# Patient Record
Sex: Female | Born: 1985
Health system: Southern US, Community
[De-identification: ages and names within clinical notes are randomized; demographics above are authoritative.]

## PROBLEM LIST (undated history)

## (undated) DIAGNOSIS — I499 Cardiac arrhythmia, unspecified: Secondary | ICD-10-CM

## (undated) DIAGNOSIS — F32A Depression, unspecified: Secondary | ICD-10-CM

## (undated) DIAGNOSIS — F419 Anxiety disorder, unspecified: Secondary | ICD-10-CM

## (undated) DIAGNOSIS — N200 Calculus of kidney: Secondary | ICD-10-CM

## (undated) DIAGNOSIS — N390 Urinary tract infection, site not specified: Secondary | ICD-10-CM

## (undated) DIAGNOSIS — D219 Benign neoplasm of connective and other soft tissue, unspecified: Secondary | ICD-10-CM

## (undated) DIAGNOSIS — R000 Tachycardia, unspecified: Secondary | ICD-10-CM

## (undated) DIAGNOSIS — T7840XA Allergy, unspecified, initial encounter: Secondary | ICD-10-CM

## (undated) HISTORY — DX: Calculus of kidney: N20.0

## (undated) HISTORY — DX: Cardiac arrhythmia, unspecified: I49.9

## (undated) HISTORY — DX: Benign neoplasm of connective and other soft tissue, unspecified: D21.9

## (undated) HISTORY — DX: Allergy, unspecified, initial encounter: T78.40XA

## (undated) HISTORY — DX: Anxiety disorder, unspecified: F41.9

## (undated) HISTORY — PX: EYE SURGERY: SHX253

## (undated) HISTORY — DX: Urinary tract infection, site not specified: N39.0

## (undated) HISTORY — PX: TONSILLECTOMY: SUR1361

## (undated) HISTORY — DX: Depression, unspecified: F32.A

---
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
## 2012-12-26 ENCOUNTER — Ambulatory Visit: Payer: Self-pay

## 2012-12-26 LAB — COMPREHENSIVE METABOLIC PANEL
Albumin: 4.6 g/dL (ref 3.4–5.0)
Anion Gap: 13 (ref 7–16)
BUN: 11 mg/dL (ref 7–18)
Calcium, Total: 9.1 mg/dL (ref 8.5–10.1)
Chloride: 105 mmol/L (ref 98–107)
Co2: 25 mmol/L (ref 21–32)
Creatinine: 0.86 mg/dL (ref 0.60–1.30)
EGFR (Non-African Amer.): >60
Potassium: 3.8 mmol/L (ref 3.5–5.1)
SGOT(AST): 15 U/L (ref 15–37)
SGPT (ALT): 21 U/L (ref 12–78)
Sodium: 143 mmol/L (ref 136–145)

## 2012-12-26 LAB — CBC WITH DIFFERENTIAL/PLATELET
Basophil %: 0.4 %
Eosinophil #: 0.5 10*3/uL (ref 0.0–0.7)
Eosinophil %: 6.2 %
HCT: 43.1 % (ref 35.0–47.0)
HGB: 14.8 g/dL (ref 12.0–16.0)
Lymphocyte #: 2.2 10*3/uL (ref 1.0–3.6)
Lymphocyte %: 25.8 %
MCH: 29.7 pg (ref 26.0–34.0)
MCV: 87 fL (ref 80–100)
Neutrophil %: 59.4 %
Platelet: 250 10*3/uL (ref 150–440)
RBC: 4.97 10*6/uL (ref 3.80–5.20)
RDW: 13 % (ref 11.5–14.5)
WBC: 8.4 10*3/uL (ref 3.6–11.0)

## 2012-12-26 LAB — URINALYSIS, COMPLETE
Bilirubin,UR: NEGATIVE
Glucose,UR: NEGATIVE mg/dL (ref 0–75)
Nitrite: NEGATIVE
Ph: 7.5 (ref 4.5–8.0)
Protein: NEGATIVE
Specific Gravity: 1.01 (ref 1.003–1.030)

## 2012-12-26 LAB — PREGNANCY, URINE: Pregnancy Test, Urine: NEGATIVE m[IU]/mL

## 2012-12-26 LAB — AMYLASE: Amylase: 52 U/L (ref 25–115)

## 2012-12-27 ENCOUNTER — Ambulatory Visit: Payer: Self-pay

## 2012-12-28 LAB — URINE CULTURE

## 2015-08-16 DIAGNOSIS — Z01419 Encounter for gynecological examination (general) (routine) without abnormal findings: Secondary | ICD-10-CM | POA: Diagnosis not present

## 2015-09-06 DIAGNOSIS — E559 Vitamin D deficiency, unspecified: Secondary | ICD-10-CM | POA: Diagnosis not present

## 2015-11-23 DIAGNOSIS — H5213 Myopia, bilateral: Secondary | ICD-10-CM | POA: Diagnosis not present

## 2015-11-23 DIAGNOSIS — H52221 Regular astigmatism, right eye: Secondary | ICD-10-CM | POA: Diagnosis not present

## 2015-12-03 ENCOUNTER — Encounter (HOSPITAL_COMMUNITY): Payer: Self-pay | Admitting: Emergency Medicine

## 2015-12-03 ENCOUNTER — Emergency Department (HOSPITAL_COMMUNITY)
Admission: EM | Admit: 2015-12-03 | Discharge: 2015-12-03 | Disposition: A | Discharge status: Home / Self Care | Payer: 59 | Attending: Emergency Medicine | Admitting: Emergency Medicine

## 2015-12-03 DIAGNOSIS — Z79899 Other long term (current) drug therapy: Secondary | ICD-10-CM | POA: Insufficient documentation

## 2015-12-03 DIAGNOSIS — T700XXA Otitic barotrauma, initial encounter: Secondary | ICD-10-CM | POA: Diagnosis not present

## 2015-12-03 DIAGNOSIS — H9209 Otalgia, unspecified ear: Secondary | ICD-10-CM | POA: Diagnosis present

## 2015-12-03 MED ORDER — GUAIFENESIN 100 MG/5ML PO LIQD: 100.0000 mg | ORAL | Status: DC | PRN

## 2015-12-03 MED ORDER — OXYMETAZOLINE HCL 0.05 % NA SOLN
1.0000 | Freq: Two times a day (BID) | NASAL | Status: DC | PRN
  Administered 2015-12-03: 1 via NASAL
  Filled 2015-12-03: qty 15

## 2015-12-03 NOTE — ED Notes (Signed)
Pt. reports bilateral ear ache more at left side onset yesterday while in an airplane , denies injury or drainage . No loss of hearing .

## 2015-12-03 NOTE — ED Provider Notes (Signed)
CSN: [id number]     Arrival date & time 12/03/15  0402 History   First MD Initiated Contact with Patient 12/03/15 0501     Chief Complaint  Patient presents with  . Otalgia     (Consider location/radiation/quality/duration/timing/severity/associated sxs/prior Treatment) HPI  Patient to the ER for evaluation of bilateral ear pressure. She flew on an airplane yesterday and when she landed she felt pressure behind her ears and says it sounds like she is under water. She has tried to take Sudafed but it did not relief her pain.   Her pulse is between 100-120 in the ED, she says the doctors and especially hospitals make her very nervous and that this is a normal response for her. She also says that typically her BP really shots up.. She declines wanting to be evaluated for this.   History reviewed. No pertinent past medical history. Past Surgical History  Procedure Laterality Date  . Tonsillectomy     No family history on file. Social History  Substance Use Topics  . Smoking status: Never Smoker   . Smokeless tobacco: None  . Alcohol Use: Yes   OB History    No data available     Review of Systems  Review of Systems All other systems negative except as documented in the HPI. All pertinent positives and negatives as reviewed in the HPI.   Allergies  Cephalosporins; Erythromycin; Penicillins; Sulfa antibiotics; Ceclor; Septra; and Suprax  Home Medications   Prior to Admission medications   Medication Sig Start Date End Date Taking? Authorizing Provider  nitrofurantoin, macrocrystal-monohydrate, (MACROBID) 100 MG capsule Take 100 mg by mouth 2 (two) times daily as needed. 08/16/15  Yes Historical Provider, MD  guaiFENesin (ROBITUSSIN) 100 MG/5ML liquid Take 5-10 mLs (100-200 mg total) by mouth every 4 (four) hours as needed (ear pressure). 12/03/15   Tenise Stetler Carlota Raspberry, PA-C   BP 126/84 mmHg  Pulse 118  Temp(Src) 97.8 F (36.6 C) (Oral)  Resp 20  Ht 5\' 3"  (1.6 m)  Wt 58.514  kg  BMI 22.86 kg/m2  SpO2 100%  LMP 11/26/2015 (Approximate) Physical Exam  Constitutional: She appears well-developed and well-nourished. No distress.  HENT:  Head: Normocephalic and atraumatic.  Bilateral retracted TMs. No sign of infection or fluid collection  Eyes: Conjunctivae, EOM and lids are normal. Pupils are equal, round, and reactive to light.  Neck: Normal range of motion. Neck supple. No spinous process tenderness and no muscular tenderness present.  Cardiovascular: Normal rate and regular rhythm.   Pulmonary/Chest: Effort normal.  Abdominal: Soft.  Neurological: She is alert.  Skin: Skin is warm and dry.  Nursing note and vitals reviewed.   ED Course  Procedures (including critical care time) Labs Review Labs Reviewed - No data to display  Imaging Review No results found. I have personally reviewed and evaluated these images and lab results as part of my medical decision-making.   EKG Interpretation None      MDM   Final diagnoses:  Barotitis media, initial encounter   The patient does not have any sign of infection. She tried Sudafed but it did not help. Will Rx:  Guaifenesin and Afrin  Referral to ENT  Medications  oxymetazoline (AFRIN) 0.05 % nasal spray 1 spray (not administered)   I discussed results, diagnoses and plan with Dorothy Fernandez. They voice there understanding and questions were answered. We discussed follow-up recommendations and return precautions.   The patients pulse is in the 120's discussed this with Dr. Stark Jock  and he is comfortable with the patient going home with her explanation of having tachycardia because of anxiety and white coat syndrome.     Dorothy Haring, PA-C 12/03/15 [id number]  Veryl Speak, MD 12/03/15 [phone]

## 2015-12-03 NOTE — Discharge Instructions (Signed)

## 2017-02-28 DIAGNOSIS — H52221 Regular astigmatism, right eye: Secondary | ICD-10-CM | POA: Diagnosis not present

## 2017-02-28 DIAGNOSIS — H5213 Myopia, bilateral: Secondary | ICD-10-CM | POA: Diagnosis not present

## 2017-11-22 ENCOUNTER — Other Ambulatory Visit: Payer: Self-pay

## 2017-11-22 ENCOUNTER — Other Ambulatory Visit: Payer: Self-pay | Admitting: *Deleted

## 2017-11-22 ENCOUNTER — Ambulatory Visit: Payer: Managed Care, Other (non HMO) | Admitting: Physician Assistant

## 2017-11-22 ENCOUNTER — Encounter: Payer: Self-pay | Admitting: Physician Assistant

## 2017-11-22 VITALS — BP 112/82 | HR 139 | Temp 98.8°F | Resp 16 | Ht 63.5 in | Wt 111.4 lb

## 2017-11-22 DIAGNOSIS — Z0000 Encounter for general adult medical examination without abnormal findings: Secondary | ICD-10-CM | POA: Diagnosis not present

## 2017-11-22 DIAGNOSIS — Z1329 Encounter for screening for other suspected endocrine disorder: Secondary | ICD-10-CM

## 2017-11-22 DIAGNOSIS — R000 Tachycardia, unspecified: Secondary | ICD-10-CM | POA: Diagnosis not present

## 2017-11-22 DIAGNOSIS — Z13220 Encounter for screening for lipoid disorders: Secondary | ICD-10-CM

## 2017-11-22 DIAGNOSIS — G479 Sleep disorder, unspecified: Secondary | ICD-10-CM | POA: Diagnosis not present

## 2017-11-22 DIAGNOSIS — Z1389 Encounter for screening for other disorder: Secondary | ICD-10-CM | POA: Diagnosis not present

## 2017-11-22 DIAGNOSIS — Z13228 Encounter for screening for other metabolic disorders: Secondary | ICD-10-CM | POA: Diagnosis not present

## 2017-11-22 DIAGNOSIS — F411 Generalized anxiety disorder: Secondary | ICD-10-CM

## 2017-11-22 DIAGNOSIS — Z130 Encounter for screening for diseases of the blood and blood-forming organs and certain disorders involving the immune mechanism: Secondary | ICD-10-CM | POA: Diagnosis not present

## 2017-11-22 LAB — POCT URINALYSIS DIP (MANUAL ENTRY)
BILIRUBIN UA: NEGATIVE mg/dL
Bilirubin, UA: NEGATIVE
Blood, UA: NEGATIVE
GLUCOSE UA: NEGATIVE mg/dL
LEUKOCYTES UA: NEGATIVE
Nitrite, UA: NEGATIVE
PROTEIN UA: NEGATIVE mg/dL
Spec Grav, UA: <=1.005 — AB (ref 1.010–1.025)
Urobilinogen, UA: 0.2 E.U./dL
pH, UA: 7 (ref 5.0–8.0)

## 2017-11-22 LAB — POCT URINE PREGNANCY: PREG TEST UR: NEGATIVE

## 2017-11-22 MED ORDER — BUSPIRONE HCL 7.5 MG PO TABS: 7.5000 mg | ORAL_TABLET | Freq: Two times a day (BID) | ORAL | 0 refills | Status: DC

## 2017-11-22 MED FILL — busPIRone HCL 7.5 MG TABS: 7.5 | 45 days supply | Qty: 90 | Fill #0

## 2017-11-22 NOTE — Patient Instructions (Addendum)
Please start buspar for anxiety. Try one tablet x 4-5 days then increase to one tablet twice daily.   For tachycardia, I have placed referral to cardiology and they should contact you within 2 weeks. Try to decrease caffeine consumption in half and no caffeine after 12pm. Try to decrease benedryl by half as well.  Follow up in 2 weeks.  For therapy -- [facility] ([place], [place], [place], [place]) - [phone] [facility] [place]) - [facility] - [phone] [facility] [place] - [phone] [facility]  - [phone] (do not file insurance)   Buspirone tablets What is this medicine? BUSPIRONE (byoo SPYE rone) is used to treat anxiety disorders. This medicine may be used for other purposes; ask your health care provider or pharmacist if you have questions. COMMON BRAND NAME(S): BuSpar What should I tell my health care provider before I take this medicine? They need to know if you have any of these conditions: -kidney or liver disease -an unusual or allergic reaction to buspirone, other medicines, foods, dyes, or preservatives -pregnant or trying to get pregnant -breast-feeding How should I use this medicine? Take this medicine by mouth with a glass of water. Follow the directions on the prescription label. You may take this medicine with or without food. To ensure that this medicine always works the same way for you, you should take it either always with or always without food. Take your doses at regular intervals. Do not take your medicine more often than directed. Do not stop taking except on the advice of your doctor or health care professional. Talk to your pediatrician regarding the use of this medicine in children. Special care may be needed. Overdosage: If you think you have taken too much of this medicine contact a  poison control center or emergency room at once. NOTE: This medicine is only for you. Do not share this medicine with others. What if I miss a dose? If you miss a dose, take it as soon as you can. If it is almost time for your next dose, take only that dose. Do not take double or extra doses. What may interact with this medicine? Do not take this medicine with any of the following medications: -linezolid -MAOIs like Carbex, Eldepryl, Marplan, Nardil, and Parnate -methylene blue -procarbazine This medicine may also interact with the following medications: -diazepam -digoxin -diltiazem -erythromycin -grapefruit juice -haloperidol -medicines for mental depression or mood problems -medicines for seizures like carbamazepine, phenobarbital and phenytoin -nefazodone -other medications for anxiety -rifampin -ritonavir -some antifungal medicines like itraconazole, ketoconazole, and voriconazole -verapamil -warfarin This list may not describe all possible interactions. Give your health care provider a list of all the medicines, herbs, non-prescription drugs, or dietary supplements you use. Also tell them if you smoke, drink alcohol, or use illegal drugs. Some items may interact with your medicine. What should I watch for while using this medicine? Visit your doctor or health care professional for regular checks on your progress. It may take 1 to 2 weeks before your anxiety gets better. You may get drowsy or dizzy. Do not drive, use machinery, or do anything that needs mental alertness until you know how this drug affects you. Do not stand or sit up quickly, especially if you are an older patient. This reduces the risk of dizzy or fainting spells. Alcohol can make you more drowsy and dizzy. Avoid alcoholic drinks. What side effects  may I notice from receiving this medicine? Side effects that you should report to your doctor or health care professional as soon as possible: -blurred vision or  other vision changes -chest pain -confusion -difficulty breathing -feelings of hostility or anger -muscle aches and pains -numbness or tingling in hands or feet -ringing in the ears -skin rash and itching -vomiting -weakness Side effects that usually do not require medical attention (report to your doctor or health care professional if they continue or are bothersome): -disturbed dreams, nightmares -headache -nausea -restlessness or nervousness -sore throat and nasal congestion -stomach upset This list may not describe all possible side effects. Call your doctor for medical advice about side effects. You may report side effects to FDA at 1-800-FDA-1088. Where should I keep my medicine? Keep out of the reach of children. Store at room temperature below 30 degrees C (86 degrees F). Protect from light. Keep container tightly closed. Throw away any unused medicine after the expiration date. NOTE: This sheet is a summary. It may not cover all possible information. If you have questions about this medicine, talk to your doctor, pharmacist, or health care provider.  2018 Elsevier/Gold Standard (2010-01-13 18:06:11)    Health Maintenance, Female Adopting a healthy lifestyle and getting preventive care can go a long way to promote health and wellness. Talk with your health care provider about what schedule of regular examinations is right for you. This is a good chance for you to check in with your provider about disease prevention and staying healthy. In between checkups, there are plenty of things you can do on your own. Experts have done a lot of research about which lifestyle changes and preventive measures are most likely to keep you healthy. Ask your health care provider for more information. Weight and diet Eat a healthy diet  Be sure to include plenty of vegetables, fruits, low-fat dairy products, and lean protein.  Do not eat a lot of foods high in solid fats, added sugars, or  salt.  Get regular exercise. This is one of the most important things you can do for your health. ? Most adults should exercise for at least 150 minutes each week. The exercise should increase your heart rate and make you sweat (moderate-intensity exercise). ? Most adults should also do strengthening exercises at least twice a week. This is in addition to the moderate-intensity exercise.  Maintain a healthy weight  Body mass index (BMI) is a measurement that can be used to identify possible weight problems. It estimates body fat based on height and weight. Your health care provider can help determine your BMI and help you achieve or maintain a healthy weight.  For females 46 years of age and older: ? A BMI below 18.5 is considered underweight. ? A BMI of 18.5 to 24.9 is normal. ? A BMI of 25 to 29.9 is considered overweight. ? A BMI of 30 and above is considered obese.  Watch levels of cholesterol and blood lipids  You should start having your blood tested for lipids and cholesterol at 32 years of age, then have this test every 5 years.  You may need to have your cholesterol levels checked more often if: ? Your lipid or cholesterol levels are high. ? You are older than 32 years of age. ? You are at high risk for heart disease.  Cancer screening Lung Cancer  Lung cancer screening is recommended for adults 34-14 years old who are at high risk for lung cancer because of a  history of smoking.  A yearly low-dose CT scan of the lungs is recommended for people who: ? Currently smoke. ? Have quit within the past 15 years. ? Have at least a 30-pack-year history of smoking. A pack year is smoking an average of one pack of cigarettes a day for 1 year.  Yearly screening should continue until it has been 15 years since you quit.  Yearly screening should stop if you develop a health problem that would prevent you from having lung cancer treatment.  Breast Cancer  Practice breast  self-awareness. This means understanding how your breasts normally appear and feel.  It also means doing regular breast self-exams. Let your health care provider know about any changes, no matter how small.  If you are in your 20s or 30s, you should have a clinical breast exam (CBE) by a health care provider every 1-3 years as part of a regular health exam.  If you are 45 or older, have a CBE every year. Also consider having a breast X-ray (mammogram) every year.  If you have a family history of breast cancer, talk to your health care provider about genetic screening.  If you are at high risk for breast cancer, talk to your health care provider about having an MRI and a mammogram every year.  Breast cancer gene (BRCA) assessment is recommended for women who have family members with BRCA-related cancers. BRCA-related cancers include: ? Breast. ? Ovarian. ? Tubal. ? Peritoneal cancers.  Results of the assessment will determine the need for genetic counseling and BRCA1 and BRCA2 testing.  Cervical Cancer Your health care provider may recommend that you be screened regularly for cancer of the pelvic organs (ovaries, uterus, and vagina). This screening involves a pelvic examination, including checking for microscopic changes to the surface of your cervix (Pap test). You may be encouraged to have this screening done every 3 years, beginning at age 51.  For women ages 84-65, health care providers may recommend pelvic exams and Pap testing every 3 years, or they may recommend the Pap and pelvic exam, combined with testing for human papilloma virus (HPV), every 5 years. Some types of HPV increase your risk of cervical cancer. Testing for HPV may also be done on women of any age with unclear Pap test results.  Other health care providers may not recommend any screening for nonpregnant women who are considered low risk for pelvic cancer and who do not have symptoms. Ask your health care provider if a  screening pelvic exam is right for you.  If you have had past treatment for cervical cancer or a condition that could lead to cancer, you need Pap tests and screening for cancer for at least 20 years after your treatment. If Pap tests have been discontinued, your risk factors (such as having a new sexual partner) need to be reassessed to determine if screening should resume. Some women have medical problems that increase the chance of getting cervical cancer. In these cases, your health care provider may recommend more frequent screening and Pap tests.  Colorectal Cancer  This type of cancer can be detected and often prevented.  Routine colorectal cancer screening usually begins at 32 years of age and continues through 32 years of age.  Your health care provider may recommend screening at an earlier age if you have risk factors for colon cancer.  Your health care provider may also recommend using home test kits to check for hidden blood in the stool.  A small  camera at the end of a tube can be used to examine your colon directly (sigmoidoscopy or colonoscopy). This is done to check for the earliest forms of colorectal cancer.  Routine screening usually begins at age 55.  Direct examination of the colon should be repeated every 5-10 years through 32 years of age. However, you may need to be screened more often if early forms of precancerous polyps or small growths are found.  Skin Cancer  Check your skin from head to toe regularly.  Tell your health care provider about any new moles or changes in moles, especially if there is a change in a mole's shape or color.  Also tell your health care provider if you have a mole that is larger than the size of a pencil eraser.  Always use sunscreen. Apply sunscreen liberally and repeatedly throughout the day.  Protect yourself by wearing long sleeves, pants, a wide-brimmed hat, and sunglasses whenever you are outside.  Heart disease, diabetes, and  high blood pressure  High blood pressure causes heart disease and increases the risk of stroke. High blood pressure is more likely to develop in: ? People who have blood pressure in the high end of the normal range (130-139/85-89 mm Hg). ? People who are overweight or obese. ? People who are African American.  If you are 70-93 years of age, have your blood pressure checked every 3-5 years. If you are 58 years of age or older, have your blood pressure checked every year. You should have your blood pressure measured twice-once when you are at a hospital or clinic, and once when you are not at a hospital or clinic. Record the average of the two measurements. To check your blood pressure when you are not at a hospital or clinic, you can use: ? An automated blood pressure machine at a pharmacy. ? A home blood pressure monitor.  If you are between 42 years and 71 years old, ask your health care provider if you should take aspirin to prevent strokes.  Have regular diabetes screenings. This involves taking a blood sample to check your fasting blood sugar level. ? If you are at a normal weight and have a low risk for diabetes, have this test once every three years after 32 years of age. ? If you are overweight and have a high risk for diabetes, consider being tested at a younger age or more often. Preventing infection Hepatitis B  If you have a higher risk for hepatitis B, you should be screened for this virus. You are considered at high risk for hepatitis B if: ? You were born in a country where hepatitis B is common. Ask your health care provider which countries are considered high risk. ? Your parents were born in a high-risk country, and you have not been immunized against hepatitis B (hepatitis B vaccine). ? You have HIV or AIDS. ? You use needles to inject street drugs. ? You live with someone who has hepatitis B. ? You have had sex with someone who has hepatitis B. ? You get hemodialysis  treatment. ? You take certain medicines for conditions, including cancer, organ transplantation, and autoimmune conditions.  Hepatitis C  Blood testing is recommended for: ? Everyone born from [birth year] through 1965. ? Anyone with known risk factors for hepatitis C.  Sexually transmitted infections (STIs)  You should be screened for sexually transmitted infections (STIs) including gonorrhea and chlamydia if: ? You are sexually active and are younger than 32 years of  age. ? You are older than 32 years of age and your health care provider tells you that you are at risk for this type of infection. ? Your sexual activity has changed since you were last screened and you are at an increased risk for chlamydia or gonorrhea. Ask your health care provider if you are at risk.  If you do not have HIV, but are at risk, it may be recommended that you take a prescription medicine daily to prevent HIV infection. This is called pre-exposure prophylaxis (PrEP). You are considered at risk if: ? You are sexually active and do not regularly use condoms or know the HIV status of your partner(s). ? You take drugs by injection. ? You are sexually active with a partner who has HIV.  Talk with your health care provider about whether you are at high risk of being infected with HIV. If you choose to begin PrEP, you should first be tested for HIV. You should then be tested every 3 months for as long as you are taking PrEP. Pregnancy  If you are premenopausal and you may become pregnant, ask your health care provider about preconception counseling.  If you may become pregnant, take 400 to 800 micrograms (mcg) of folic acid every day.  If you want to prevent pregnancy, talk to your health care provider about birth control (contraception). Osteoporosis and menopause  Osteoporosis is a disease in which the bones lose minerals and strength with aging. This can result in serious bone fractures. Your risk for osteoporosis  can be identified using a bone density scan.  If you are 73 years of age or older, or if you are at risk for osteoporosis and fractures, ask your health care provider if you should be screened.  Ask your health care provider whether you should take a calcium or vitamin D supplement to lower your risk for osteoporosis.  Menopause may have certain physical symptoms and risks.  Hormone replacement therapy may reduce some of these symptoms and risks. Talk to your health care provider about whether hormone replacement therapy is right for you. Follow these instructions at home:  Schedule regular health, dental, and eye exams.  Stay current with your immunizations.  Do not use any tobacco products including cigarettes, chewing tobacco, or electronic cigarettes.  If you are pregnant, do not drink alcohol.  If you are breastfeeding, limit how much and how often you drink alcohol.  Limit alcohol intake to no more than 1 drink per day for nonpregnant women. One drink equals 12 ounces of beer, 5 ounces of wine, or 1 ounces of hard liquor.  Do not use street drugs.  Do not share needles.  Ask your health care provider for help if you need support or information about quitting drugs.  Tell your health care provider if you often feel depressed.  Tell your health care provider if you have ever been abused or do not feel safe at home. This information is not intended to replace advice given to you by your health care provider. Make sure you discuss any questions you have with your health care provider. Document Released: 12/19/2010 Document Revised: 11/11/2015 Document Reviewed: 03/09/2015 Elsevier Interactive Patient Education  2018 [employer].    IF you received an x-ray today, you will receive an invoice from [facility]. Please contact [facility] at [phone] with questions or concerns regarding your invoice.   IF you received labwork today, you will receive an  invoice from [facility]. Please contact [facility]  at [phone] with questions or concerns regarding your invoice.   Our billing staff will not be able to assist you with questions regarding bills from these companies.  You will be contacted with the lab results as soon as they are available. The fastest way to get your results is to activate your My Chart account. Instructions are located on the last page of this paperwork. If you have not heard from [place] regarding the results in 2 weeks, please contact this office.

## 2017-11-22 NOTE — Progress Notes (Signed)
Dorothy Fernandez  MRN: [id number] DOB: [birth year]  Subjective:  Pt is a 32 y.o. G0P0 female who presents for annual physical exam and to establish care. She is fasting today.   Exercise: Walks twice a week.   Diet: Protein shakes, salads, fruits, eggs, milk, cheese, yogurt. Drinks mostly coffee, water, and one energy drinks. Takes multivitamin occasionally.  Sleep: Insomnia for at least 6 months, gets about 4-5 hrs a night. Has a hard time falling asleep. Cuts caffeine off at 2 pm. Lies down at 9pm, usually falls asleep around 12am. Does not watch TV in bed. Stops looking at phone right before bed. Takes 75m of benadryl every night around 7-8pm x 6 months. Has tried melatonin   Menstural cycles: Regular, occur every 28 days.   Family planning: Patient is wanting to have children eventually.  She wants to get her anxiety under control before she starts to plan.  Does not want to take birth control at this time.  Plans to use fertility calendar, condoms, and pullout method as contraceptive.   Last dental exam: 2 years ago.  Last vision exam: 03/2017 Last pap smear: 3 years ago, wants to establish with gynecologist.  Vaccinations      Tetanus: 2016      HPV: Completed        Chronic issues: 1) Heart palpitations: Has had this issue for years but has never had it worked up. Did used to take propraolol 129mfor worsening sensation in school during presentations, it helped but did make her fatigued. Feels like her heart is beating fast most of the day. Sometimes has skipped beats. Denies chest pain, SOB, diaphoresis, lower leg swelling, nausea, and vomiting. Used to jog a lot and did not notice much difference in heart rate response. Does feel more stressed recently. Drinks 4-5 cups of coffee and occasional energy drink throughout the day. Drinks at least 64 oz of water a day. Denies smoking. Denies illicit drug use. Will drink alcohol occasionally. Takes benedryl 5021mightly for sleep. No PMH  of heart disease or thyroid disease. FH of tachycardia in mother, she takes verapamil.   2) Anxiety: Dealing with since entire life. Has exacerbated since she graduated in 2017.  Since starting her job she cannot control her worrying.  She worries about everything.  She has restlessness, difficulty concentrating, irritability, sleep disturbance.  Denies dysphoric mood, hallucinations, panic attacks, suicidal ideation, and homicidal ideation.  Has never seen counselor or tried medication for this. FH of chronic fatigue syndrome.   3) Recurrent UTIs: Takes macrobid 100m71mter sexual intercourse, Rx by gynecologist.   There are no active problems to display for this patient.   Current Outpatient Medications on File Prior to Visit  Medication Sig Dispense Refill  . nitrofurantoin, macrocrystal-monohydrate, (MACROBID) 100 MG capsule Take 100 mg by mouth 2 (two) times daily as needed.    . propranolol (INDERAL) 20 MG tablet Take by mouth.    . Multiple Vitamin (MULTIVITAMIN) capsule Take by mouth.    . triamcinolone cream (KENALOG) 0.1 % daily.     No current facility-administered medications on file prior to visit.     Allergies  Allergen Reactions  . Cephalosporins Rash  . Erythromycin Rash  . Penicillins Rash  . Sulfa Antibiotics Rash  . Ceclor [Cefaclor] Hives  . Septra [Sulfamethoxazole-Trimethoprim]   . Suprax [Cefixime] Hives    Social History   Socioeconomic History  . Marital status: Married    Spouse name: Not on  file  . Number of children: Not on file  . Years of education: Not on file  . Highest education level: Not on file  Occupational History  . Not on file  Social Needs  . Financial resource strain: Not on file  . Food insecurity:    Worry: Not on file    Inability: Not on file  . Transportation needs:    Medical: Not on file    Non-medical: Not on file  Tobacco Use  . Smoking status: Never Smoker  . Smokeless tobacco: Never Used  Substance and Sexual  Activity  . Alcohol use: Not Currently  . Drug use: No  . Sexual activity: Not on file  Lifestyle  . Physical activity:    Days per week: Not on file    Minutes per session: Not on file  . Stress: Not on file  Relationships  . Social connections:    Talks on phone: Not on file    Gets together: Not on file    Attends religious service: Not on file    Active member of club or organization: Not on file    Attends meetings of clubs or organizations: Not on file    Relationship status: Not on file  Other Topics Concern  . Not on file  Social History Narrative  . Not on file    Past Surgical History:  Procedure Laterality Date  . TONSILLECTOMY      Family History  Problem Relation Age of Onset  . Hyperlipidemia Mother   . Hyperlipidemia Father   . Cancer Maternal Grandmother        Cervical  . Diabetes Maternal Grandfather     Review of Systems  Constitutional: Negative for activity change, appetite change, chills, diaphoresis, fatigue, fever and unexpected weight change.  HENT: Negative for congestion, dental problem, drooling, ear discharge, ear pain, facial swelling, hearing loss, mouth sores, nosebleeds, postnasal drip, rhinorrhea, sinus pressure, sinus pain, sneezing, sore throat, tinnitus, trouble swallowing and voice change.   Eyes: Negative for photophobia, pain, discharge, redness, itching and visual disturbance.  Respiratory: Negative for apnea, cough, choking, chest tightness, shortness of breath, wheezing and stridor.   Cardiovascular: Positive for palpitations. Negative for chest pain and leg swelling.  Gastrointestinal: Negative for abdominal distention, abdominal pain, anal bleeding, blood in stool, constipation, diarrhea, nausea, rectal pain and vomiting.  Endocrine: Negative for cold intolerance, heat intolerance, polydipsia, polyphagia and polyuria.  Genitourinary: Negative for decreased urine volume, difficulty urinating, dyspareunia, dysuria, enuresis,  flank pain, frequency, genital sores, hematuria, menstrual problem, pelvic pain, urgency, vaginal bleeding, vaginal discharge and vaginal pain.  Musculoskeletal: Negative for arthralgias, back pain, gait problem, joint swelling, myalgias, neck pain and neck stiffness.  Skin: Negative for color change, pallor, rash and wound.  Allergic/Immunologic: Negative for environmental allergies, food allergies and immunocompromised state.  Neurological: Negative for dizziness, tremors, seizures, syncope, facial asymmetry, speech difficulty, weakness, light-headedness, numbness and headaches.  Hematological: Negative for adenopathy. Does not bruise/bleed easily.  Psychiatric/Behavioral: Positive for sleep disturbance. Negative for agitation, behavioral problems, confusion, decreased concentration, dysphoric mood, hallucinations, self-injury and suicidal ideas. The patient is nervous/anxious. The patient is not hyperactive.       Objective:  BP 112/82 (BP Location: Right Arm, Patient Position: Sitting, Cuff Size: Normal)   Pulse (!) 139   Temp 98.8 F (37.1 C) (Oral)   Resp 16   Ht 5' 3.5" (1.613 m)   Wt 111 lb 6.4 oz (50.5 kg)   LMP 10/28/2017  SpO2 100%   BMI 19.42 kg/m   Physical Exam  Constitutional: She is oriented to person, place, and time. She appears well-developed and well-nourished. No distress.  HENT:  Head: Normocephalic and atraumatic.  Right Ear: Hearing, tympanic membrane, external ear and ear canal normal.  Left Ear: Hearing, tympanic membrane, external ear and ear canal normal.  Nose: Nose normal.  Mouth/Throat: Uvula is midline, oropharynx is clear and moist and mucous membranes are normal. No oropharyngeal exudate.  Eyes: Pupils are equal, round, and reactive to light. Conjunctivae, EOM and lids are normal. No scleral icterus.  Neck: Trachea normal and normal range of motion. No thyroid mass and no thyromegaly present.  Cardiovascular: Regular rhythm, normal heart sounds and  intact distal pulses. Tachycardia present.  Pulmonary/Chest: Effort normal and breath sounds normal.  Abdominal: Soft. Normal appearance and bowel sounds are normal. There is no tenderness.  Lymphadenopathy:       Head (right side): No tonsillar, no preauricular, no posterior auricular and no occipital adenopathy present.       Head (left side): No tonsillar, no preauricular, no posterior auricular and no occipital adenopathy present.    She has no cervical adenopathy.       Right: No supraclavicular adenopathy present.       Left: No supraclavicular adenopathy present.  Neurological: She is alert and oriented to person, place, and time. She has normal strength and normal reflexes.  Skin: Skin is warm and dry.    Visual Acuity Screening   Right eye Left eye Both eyes  Without correction:     With correction: '20/15 20/15 20/15 '   Pulse Readings from Last 3 Encounters:  11/22/17 (!) 139  12/03/15 101    Results for orders placed or performed in visit on 11/22/17 (from the past 24 hour(s))  POCT urinalysis dipstick     Status: Abnormal   Collection Time: 11/22/17  9:14 AM  Result Value Ref Range   Color, UA yellow yellow   Clarity, UA clear clear   Glucose, UA negative negative mg/dL   Bilirubin, UA negative negative   Ketones, POC UA negative negative mg/dL   Spec Grav, UA <=1.005 (A) 1.010 - 1.025   Blood, UA negative negative   pH, UA 7.0 5.0 - 8.0   Protein Ur, POC negative negative mg/dL   Urobilinogen, UA 0.2 0.2 or 1.0 E.U./dL   Nitrite, UA Negative Negative   Leukocytes, UA Negative Negative    GAD 7 : Generalized Anxiety Score 11/22/2017  Nervous, Anxious, on Edge 3  Control/stop worrying 3  Worry too much - different things 3  Trouble relaxing 3  Restless 3  Easily annoyed or irritable 1  Afraid - awful might happen 1  Total GAD 7 Score 17     EKG shows sinus tachycardia with rate of 118bpm. PR and QRS intervals within normal limits. No prior EKG for  comparison.  Findings presented and discussed with Dr. Nolon Rod.    Assessment and Plan :  Discussed healthy lifestyle, diet, exercise, preventative care, vaccinations, and addressed patient's concerns. Plan for follow up in 2 weeks. Otherwise, plan for specific conditions below.  1. Annual physical exam Await lab results.   2. Screening, anemia, deficiency, iron - CBC with Differential/Platelet  3. Screening for metabolic disorder - VOH60+OVPC  4. Screening cholesterol level - Lipid panel  5. Screening for thyroid disorder - TSH  6. Screening for hematuria or proteinuria - POCT urinalysis dipstick  7. Tachycardia EKG with sinus  tachycardia. Suspect high amount of caffeine, OTC daily benedryl, and anxiety and playing roles in pt's tachycardia. Encouraged her to decrease caffeine and benedryl. Adding on buspar for anxiety. Will refer to cardiology for further evaluation and possible Holter Monitor as pt does report frequent "skipped beats" throughout the day.  - EKG 12-Lead - EKG 12-Lead - Ambulatory referral to Cardiology  8. Generalized anxiety disorder Pt meets DSM 5 criteria for GAD. Rec start medication and therapy. Will start with buspar at this time. Given contact info for local therapists. Plan to f/u in 2 weeks.  - POCT urine pregnancy - busPIRone (BUSPAR) 7.5 MG tablet; Take 1 tablet (7.5 mg total) by mouth 2 (two) times daily.  Dispense: 90 tablet; Refill: 0  9. Sleep disturbance Likely excess caffeine use and anxiety playing a huge role. Rec decreasing caffeine, no caffeine after 12pm, and treating anxiety. Will f/u in 2 weeks. Can consider trazodone if no improvement at that time.   Side effects, risks, benefits, and alternatives of the medications and treatment plan prescribed today were discussed, and patient expressed understanding of the instructions given. No barriers to understanding were identified. Red flags discussed in detail. Pt expressed understanding  regarding what to do in case of emergency/urgent symptoms.  Tenna Delaine, PA-C  [facility] 11/23/2017 4:13 PM

## 2017-11-23 ENCOUNTER — Encounter: Payer: Self-pay | Admitting: Physician Assistant

## 2017-11-23 LAB — LIPID PANEL
CHOLESTEROL TOTAL: 140 mg/dL (ref 100–199)
Chol/HDL Ratio: 2.2 ratio (ref 0.0–4.4)
HDL: 64 mg/dL (ref >39)
LDL Calculated: 68 mg/dL (ref 0–99)
TRIGLYCERIDES: 42 mg/dL (ref 0–149)
VLDL CHOLESTEROL CAL: 8 mg/dL (ref 5–40)

## 2017-11-23 LAB — CBC WITH DIFFERENTIAL/PLATELET
Basophils Absolute: 0.1 10*3/uL (ref 0.0–0.2)
Basos: 1 %
EOS (ABSOLUTE): 0.1 10*3/uL (ref 0.0–0.4)
Eos: 1 %
Hematocrit: 40.6 % (ref 34.0–46.6)
Hemoglobin: 14.2 g/dL (ref 11.1–15.9)
Immature Grans (Abs): 0 10*3/uL (ref 0.0–0.1)
Immature Granulocytes: 0 %
LYMPHS ABS: 2.2 10*3/uL (ref 0.7–3.1)
Lymphs: 35 %
MCH: 30.9 pg (ref 26.6–33.0)
MCHC: 35 g/dL (ref 31.5–35.7)
MCV: 89 fL (ref 79–97)
Monocytes Absolute: 0.6 10*3/uL (ref 0.1–0.9)
Monocytes: 10 %
NEUTROS ABS: 3.3 10*3/uL (ref 1.4–7.0)
Neutrophils: 53 %
PLATELETS: 279 10*3/uL (ref 150–450)
RBC: 4.59 x10E6/uL (ref 3.77–5.28)
RDW: 13.7 % (ref 12.3–15.4)
WBC: 6.2 10*3/uL (ref 3.4–10.8)

## 2017-11-23 LAB — CMP14+EGFR
ALK PHOS: 56 IU/L (ref 39–117)
ALT: 8 IU/L (ref 0–32)
AST: 19 IU/L (ref 0–40)
Albumin/Globulin Ratio: 2.1 (ref 1.2–2.2)
Albumin: 4.8 g/dL (ref 3.5–5.5)
BILIRUBIN TOTAL: 0.7 mg/dL (ref 0.0–1.2)
BUN / CREAT RATIO: 14 (ref 9–23)
BUN: 9 mg/dL (ref 6–20)
CHLORIDE: 108 mmol/L — AB (ref 96–106)
CO2: 19 mmol/L — AB (ref 20–29)
Calcium: 10 mg/dL (ref 8.7–10.2)
Creatinine, Ser: 0.66 mg/dL (ref 0.57–1.00)
GFR calc Af Amer: 135 mL/min/{1.73_m2} (ref >59)
GFR calc non Af Amer: 117 mL/min/{1.73_m2} (ref >59)
Globulin, Total: 2.3 g/dL (ref 1.5–4.5)
Glucose: 90 mg/dL (ref 65–99)
Potassium: 4.8 mmol/L (ref 3.5–5.2)
Sodium: 141 mmol/L (ref 134–144)
Total Protein: 7.1 g/dL (ref 6.0–8.5)

## 2017-11-23 LAB — TSH: TSH: 1.3 u[IU]/mL (ref 0.450–4.500)

## 2017-12-25 ENCOUNTER — Other Ambulatory Visit: Payer: Self-pay | Admitting: Internal Medicine

## 2017-12-25 ENCOUNTER — Other Ambulatory Visit: Payer: Self-pay | Admitting: Cardiology

## 2017-12-25 ENCOUNTER — Ambulatory Visit (INDEPENDENT_AMBULATORY_CARE_PROVIDER_SITE_OTHER): Payer: Managed Care, Other (non HMO)

## 2017-12-25 DIAGNOSIS — R000 Tachycardia, unspecified: Secondary | ICD-10-CM

## 2017-12-25 DIAGNOSIS — I499 Cardiac arrhythmia, unspecified: Secondary | ICD-10-CM

## 2017-12-28 ENCOUNTER — Encounter: Payer: Self-pay | Admitting: Physician Assistant

## 2017-12-28 ENCOUNTER — Other Ambulatory Visit: Payer: Self-pay

## 2017-12-28 ENCOUNTER — Ambulatory Visit: Payer: Managed Care, Other (non HMO) | Admitting: Physician Assistant

## 2017-12-28 VITALS — BP 124/78 | HR 103 | Temp 98.4°F | Resp 20 | Ht 63.9 in | Wt 112.0 lb

## 2017-12-28 DIAGNOSIS — F411 Generalized anxiety disorder: Secondary | ICD-10-CM

## 2017-12-28 DIAGNOSIS — G479 Sleep disorder, unspecified: Secondary | ICD-10-CM | POA: Diagnosis not present

## 2017-12-28 MED ORDER — TRAZODONE HCL 50 MG PO TABS: 25.0000 mg | ORAL_TABLET | Freq: Every evening | ORAL | 3 refills | Status: DC | PRN

## 2017-12-28 MED ORDER — BUSPIRONE HCL 10 MG PO TABS: 10.0000 mg | ORAL_TABLET | Freq: Two times a day (BID) | ORAL | 0 refills | Status: DC

## 2017-12-28 MED FILL — busPIRone HCL 10 MG TABS: 10 | 90 days supply | Qty: 180 | Fill #0

## 2017-12-28 MED FILL — traZODone HCL 50 MG TABS: 50 | 30 days supply | Qty: 30 | Fill #0

## 2017-12-28 NOTE — Progress Notes (Signed)
Dorothy Fernandez  MRN: [id number] DOB: 12/20/1985  Subjective:  Dorothy Fernandez is a 32 y.o. female seen in office today for a chief complaint of follow-up on anxiety and sleep issues.  She was last seen on 11/22/2017 for annual physical exam.  During this time she was diagnosed with generalized anxiety disorder.  Started on BuSpar twice daily.  Notes she is tolerating the medication well.  Denies any side effects from the medications.  Denies dizziness, insomnia, drowsiness, nausea, vomiting.  In terms of anxiety, notes it is working pretty well.  Her husband has noticed the biggest difference.  He feels like she is a completely different person in terms of anxiety and irritability.  She feels that she is worrying less and being able to rest a little better.  In terms of her sleep disturbance, she is sleeping better but still having some disturbance.  Has completely changed this to sleep hygiene.  She has eliminated all caffeine at this time.  No phone in the bed.  Still having difficulty staying asleep throughout the entire night.  Is interested in trying further medication for this.  Denies hallucinations, euphoria, suicidal ideation, and homicidal ideation.  Review of Systems Per HPI There are no active problems to display for this patient.   Current Outpatient Medications on File Prior to Visit  Medication Sig Dispense Refill  . busPIRone (BUSPAR) 7.5 MG tablet Take 1 tablet (7.5 mg total) by mouth 2 (two) times daily. 90 tablet 0  . Multiple Vitamin (MULTIVITAMIN) capsule Take by mouth.    . nitrofurantoin, macrocrystal-monohydrate, (MACROBID) 100 MG capsule Take 100 mg by mouth 2 (two) times daily as needed.    . propranolol (INDERAL) 20 MG tablet Take by mouth.    . triamcinolone cream (KENALOG) 0.1 % daily.     No current facility-administered medications on file prior to visit.     Allergies  Allergen Reactions  . Cephalosporins Rash  . Erythromycin Rash  . Penicillins Rash  . Sulfa  Antibiotics Rash  . Ceclor [Cefaclor] Hives  . Septra [Sulfamethoxazole-Trimethoprim]   . Suprax [Cefixime] Hives       Objective:  BP 124/78 (BP Location: Left Arm, Patient Position: Sitting, Cuff Size: Normal)   Pulse (!) 103   Temp 98.4 F (36.9 C) (Other (Comment))   Resp 20   Ht 5' 3.9" (1.623 m)   Wt 112 lb (50.8 kg)   LMP 12/26/2017 (Exact Date)   SpO2 100%   BMI 19.29 kg/m   Physical Exam  Constitutional: She is oriented to person, place, and time. She appears well-developed and well-nourished. No distress.  HENT:  Head: Normocephalic and atraumatic.  Eyes: Conjunctivae are normal.  Neck: Normal range of motion.  Cardiovascular: Regular rhythm and normal heart sounds. Tachycardia present.  Pulmonary/Chest: Effort normal.  Neurological: She is alert and oriented to person, place, and time.  Skin: Skin is warm and dry.  Psychiatric: She has a normal mood and affect.  Vitals reviewed.    GAD 7 : Generalized Anxiety Score 12/28/2017 11/22/2017  Nervous, Anxious, on Edge 3 3  Control/stop worrying 3 3  Worry too much - different things 3 3  Trouble relaxing 3 3  Restless 0 3  Easily annoyed or irritable 0 1  Afraid - awful might happen 1 1  Total GAD 7 Score 13 17  Anxiety Difficulty Not difficult at all -    Depression screen [facility] 2/9 12/28/2017 12/28/2017 11/22/2017 11/22/2017  Decreased Interest 0  0 0 0  Down, Depressed, Hopeless 0 0 0 0  PHQ - 2 Score 0 0 0 0  Altered sleeping 3 3 3  -  Tired, decreased energy 3 3 3  -  Change in appetite 0 0 0 -  Feeling bad or failure about yourself  0 0 1 -  Trouble concentrating 1 1 3  -  Moving slowly or fidgety/restless 0 0 0 -  Suicidal thoughts 0 0 0 -  PHQ-9 Score 7 7 10  -   Wt Readings from Last 3 Encounters:  12/28/17 112 lb (50.8 kg)  11/22/17 111 lb 6.4 oz (50.5 kg)  12/03/15 129 lb (58.5 kg)    Assessment and Plan :  1. Generalized anxiety disorder Improving but still think there is room for further  improvement. GAD score has decreased from 17 to 13.  Recommend increasing dose to BuSpar 10 mg twice daily.  Patient has not been able to reach out to a therapist yet but is planning to do so.  Strongly encouraged her to do this.  Follow-up in 3 months for reevaluation.   - busPIRone (BUSPAR) 10 MG tablet; Take 1 tablet (10 mg total) by mouth 2 (two) times daily.  Dispense: 180 tablet; Refill: 0  2. Sleep disturbance Encourage patient to continue healthy sleep hygiene techniques and limiting caffeine. Will attempt trial of trazodone at this time.  Follow-up in 3 months for reevaluation. - traZODone (DESYREL) 50 MG tablet; Take 0.5-1 tablets (25-50 mg total) by mouth at bedtime as needed for sleep.  Dispense: 30 tablet; Refill: 3  A total of 25 minutes was spent in the room with the patient, greater than 50% of which was in counseling/coordination of care regarding GAD and sleep disturbance.  Tenna Delaine PA-C  [facility] 12/28/2017 3:34 PM

## 2017-12-28 NOTE — Patient Instructions (Addendum)
Increase BuSpar dose to 10 mg twice daily.  For sleep, we will attempt trazodone.To start the Trazodone - start with 1/2 pill at night for the 1st 4 nights - increase the dose by 25mg  (1/2 pill) every 4-5 nights until either you sleep well, have side effects or reach a nightly dose of 150mg   Follow-up in 3 months for reevaluation.  Thank you for letting me participate in your health and well being.  Living With Anxiety After being diagnosed with an anxiety disorder, you may be relieved to know why you have felt or behaved a certain way. It is natural to also feel overwhelmed about the treatment ahead and what it will mean for your life. With care and support, you can manage this condition and recover from it. How to cope with anxiety Dealing with stress Stress is your body's reaction to life changes and events, both good and bad. Stress can last just a few hours or it can be ongoing. Stress can play a major role in anxiety, so it is important to learn both how to cope with stress and how to think about it differently. Talk with your health care provider or a counselor to learn more about stress reduction. He or she may suggest some stress reduction techniques, such as:  Music therapy. This can include creating or listening to music that you enjoy and that inspires you.  Mindfulness-based meditation. This involves being aware of your normal breaths, rather than trying to control your breathing. It can be done while sitting or walking.  Centering prayer. This is a kind of meditation that involves focusing on a word, phrase, or sacred image that is meaningful to you and that brings you peace.  Deep breathing. To do this, expand your stomach and inhale slowly through your nose. Hold your breath for 3-5 seconds. Then exhale slowly, allowing your stomach muscles to relax.  Self-talk. This is a skill where you identify thought patterns that lead to anxiety reactions and correct those  thoughts.  Muscle relaxation. This involves tensing muscles then relaxing them.  Choose a stress reduction technique that fits your lifestyle and personality. Stress reduction techniques take time and practice. Set aside 5-15 minutes a day to do them. Therapists can offer training in these techniques. The training may be covered by some insurance plans. Other things you can do to manage stress include:  Keeping a stress diary. This can help you learn what triggers your stress and ways to control your response.  Thinking about how you respond to certain situations. You may not be able to control everything, but you can control your reaction.  Making time for activities that help you relax, and not feeling guilty about spending your time in this way.  Therapy combined with coping and stress-reduction skills provides the best chance for successful treatment. Medicines Medicines can help ease symptoms. Medicines for anxiety include:  Anti-anxiety drugs.  Antidepressants.  Beta-blockers.  Medicines may be used as the main treatment for anxiety disorder, along with therapy, or if other treatments are not working. Medicines should be prescribed by a health care provider. Relationships Relationships can play a big part in helping you recover. Try to spend more time connecting with trusted friends and family members. Consider going to couples counseling, taking family education classes, or going to family therapy. Therapy can help you and others better understand the condition. How to recognize changes in your condition Everyone has a different response to treatment for anxiety. Recovery from anxiety  happens when symptoms decrease and stop interfering with your daily activities at home or work. This may mean that you will start to:  Have better concentration and focus.  Sleep better.  Be less irritable.  Have more energy.  Have improved memory.  It is important to recognize when your  condition is getting worse. Contact your health care provider if your symptoms interfere with home or work and you do not feel like your condition is improving. Where to find help and support: You can get help and support from these sources:  Self-help groups.  Online and [employer].  A trusted spiritual leader.  Couples counseling.  Family education classes.  Family therapy.  Follow these instructions at home:  Eat a healthy diet that includes plenty of vegetables, fruits, whole grains, low-fat dairy products, and lean protein. Do not eat a lot of foods that are high in solid fats, added sugars, or salt.  Exercise. Most adults should do the following: ? Exercise for at least 150 minutes each week. The exercise should increase your heart rate and make you sweat (moderate-intensity exercise). ? Strengthening exercises at least twice a week.  Cut down on caffeine, tobacco, alcohol, and other potentially harmful substances.  Get the right amount and quality of sleep. Most adults need 7-9 hours of sleep each night.  Make choices that simplify your life.  Take over-the-counter and prescription medicines only as told by your health care provider.  Avoid caffeine, alcohol, and certain over-the-counter cold medicines. These may make you feel worse. Ask your pharmacist which medicines to avoid.  Keep all follow-up visits as told by your health care provider. This is important. Questions to ask your health care provider  Would I benefit from therapy?  How often should I follow up with a health care provider?  How long do I need to take medicine?  Are there any long-term side effects of my medicine?  Are there any alternatives to taking medicine? Contact a health care provider if:  You have a hard time staying focused or finishing daily tasks.  You spend many hours a day feeling worried about everyday life.  You become exhausted by worry.  You start to have  headaches, feel tense, or have nausea.  You urinate more than normal.  You have diarrhea. Get help right away if:  You have a racing heart and shortness of breath.  You have thoughts of hurting yourself or others. If you ever feel like you may hurt yourself or others, or have thoughts about taking your own life, get help right away. You can go to your nearest emergency department or call:  Your local emergency services (911 in the [place]  A suicide crisis helpline, such as the [place] at 548-748-2888. This is open 24-hours a day.  Summary  Taking steps to deal with stress can help calm you.  Medicines cannot cure anxiety disorders, but they can help ease symptoms.  Family, friends, and partners can play a big part in helping you recover from an anxiety disorder. This information is not intended to replace advice given to you by your health care provider. Make sure you discuss any questions you have with your health care provider. Document Released: 05/30/2016 Document Revised: 05/30/2016 Document Reviewed: 05/30/2016 Elsevier Interactive Patient Education  2018 [employer].   IF you received an x-ray today, you will receive an invoice from [facility]. Please contact [facility] at [phone] with questions or concerns regarding your invoice.  IF you received labwork today, you will receive an invoice from [facility]. Please contact [facility] at [phone] with questions or concerns regarding your invoice.   Our billing staff will not be able to assist you with questions regarding bills from these companies.  You will be contacted with the lab results as soon as they are available. The fastest way to get your results is to activate your My Chart account. Instructions are located on the last page of this paperwork. If you have not heard from [place] regarding the results in 2 weeks, please contact this office.

## 2018-01-07 MED FILL — PROPRANOLOL ER 120 MG CAP: 120 | 30 days supply | Qty: 30 | Fill #0

## 2018-01-20 ENCOUNTER — Encounter: Payer: Self-pay | Admitting: Physician Assistant

## 2018-02-04 MED FILL — PROPRANOLOL ER 80 MG CAP: 80 | 30 days supply | Qty: 30 | Fill #0

## 2018-03-06 MED FILL — PROPRANOLOL ER 80 MG CAP: 80 | 30 days supply | Qty: 30 | Fill #1

## 2018-04-04 ENCOUNTER — Other Ambulatory Visit: Payer: Self-pay

## 2018-04-04 ENCOUNTER — Encounter: Payer: Self-pay | Admitting: Physician Assistant

## 2018-04-04 ENCOUNTER — Ambulatory Visit: Payer: Managed Care, Other (non HMO) | Admitting: Physician Assistant

## 2018-04-04 VITALS — BP 111/70 | HR 87 | Temp 98.1°F | Resp 18 | Ht 63.19 in | Wt 113.6 lb

## 2018-04-04 DIAGNOSIS — F411 Generalized anxiety disorder: Secondary | ICD-10-CM | POA: Diagnosis not present

## 2018-04-04 DIAGNOSIS — G479 Sleep disorder, unspecified: Secondary | ICD-10-CM | POA: Diagnosis not present

## 2018-04-04 MED ORDER — BUSPIRONE HCL 10 MG PO TABS: 10.0000 mg | ORAL_TABLET | Freq: Two times a day (BID) | ORAL | 0 refills | Status: DC

## 2018-04-04 MED ORDER — HYDROXYZINE HCL 25 MG PO TABS: 25.0000 mg | ORAL_TABLET | Freq: Every day | ORAL | 0 refills | Status: DC

## 2018-04-04 MED FILL — hydrOXYzine HCL 25 MG TABS: 25 | 90 days supply | Qty: 90 | Fill #0

## 2018-04-04 MED FILL — PROPRANOLOL ER 80 MG CAP: 80 | 30 days supply | Qty: 30 | Fill #2

## 2018-04-04 MED FILL — busPIRone HCL 10 MG TABS: 10 | 90 days supply | Qty: 180 | Fill #0

## 2018-04-04 NOTE — Progress Notes (Signed)
Dorothy Fernandez  MRN: [id number] DOB: 11-10-1985  Subjective:  Dorothy Fernandez is a 31 y.o. female seen in office today for a chief complaint of follow-up and anxiety and insomnia.  Was taking BuSpar 10 mg twice a day for anxiety.  Anxiety is improving but still having episodes of worrying.  Tolerating BuSpar well.  Has not seen counseling yet.  Insomnia still persists.  She did not tolerate trazodone.  It made her feel worse.  She does take Benadryl nightly.  This helps.  Would like to try other options.  She is also currently trying to get pregnant.  Has follow-up with gynecology in November.  No other questions or concerns today.  Review of Systems Per HPI There are no active problems to display for this patient.   Current Outpatient Medications on File Prior to Visit  Medication Sig Dispense Refill  . busPIRone (BUSPAR) 10 MG tablet Take 1 tablet (10 mg total) by mouth 2 (two) times daily. 180 tablet 0  . Multiple Vitamin (MULTIVITAMIN) capsule Take by mouth.    . nitrofurantoin, macrocrystal-monohydrate, (MACROBID) 100 MG capsule Take 100 mg by mouth 2 (two) times daily as needed.    . propranolol ER (INDERAL LA) 80 MG 24 hr capsule   10  . triamcinolone cream (KENALOG) 0.1 % daily.    . propranolol (INDERAL) 20 MG tablet Take by mouth.    . traZODone (DESYREL) 50 MG tablet Take 0.5-1 tablets (25-50 mg total) by mouth at bedtime as needed for sleep. (Patient not taking: Reported on 04/04/2018) 30 tablet 3   No current facility-administered medications on file prior to visit.     Allergies  Allergen Reactions  . Cephalosporins Rash  . Erythromycin Rash  . Penicillins Rash  . Sulfa Antibiotics Rash  . Ceclor [Cefaclor] Hives  . Septra [Sulfamethoxazole-Trimethoprim]   . Suprax [Cefixime] Hives     Objective:  BP 111/70   Pulse 87   Temp 98.1 F (36.7 C) (Oral)   Resp 18   Ht 5' 3.19" (1.605 m)   Wt 113 lb 9.6 oz (51.5 kg)   LMP 03/28/2018 (Exact Date)   SpO2 100%   BMI  20.00 kg/m   Physical Exam  Constitutional: She is oriented to person, place, and time. She appears well-developed and well-nourished.  HENT:  Head: Normocephalic and atraumatic.  Eyes: Conjunctivae are normal.  Neck: Normal range of motion.  Pulmonary/Chest: Effort normal.  Neurological: She is alert and oriented to person, place, and time.  Skin: Skin is warm and dry.  Psychiatric: She has a normal mood and affect.  Vitals reviewed.    GAD 7 : Generalized Anxiety Score 04/04/2018 12/28/2017 11/22/2017  Nervous, Anxious, on Edge 2 3 3   Control/stop worrying 2 3 3   Worry too much - different things 2 3 3   Trouble relaxing 2 3 3   Restless 0 0 3  Easily annoyed or irritable 0 0 1  Afraid - awful might happen 1 1 1   Total GAD 7 Score 9 13 17   Anxiety Difficulty Not difficult at all Not difficult at all -      Assessment and Plan :  1. Generalized anxiety disorder Continues to improve.  GAD score has decreased from 13 to 9.  May increase to BuSpar 3 times a day.  Would suggest doing 5 mg for the third dose of the day to see how she tolerates it.  If tolerating well, can increase to 10 mg 3 times a  day.  Follow-up in 3 to 6 months for reevaluation. - busPIRone (BUSPAR) 10 MG tablet; Take 1 tablet (10 mg total) by mouth 2 (two) times daily.  Dispense: 180 tablet; Refill: 0  2. Trouble in sleeping No improvement with trazodone.  Will attempt trial of hydroxyzine. - hydrOXYzine (ATARAX/VISTARIL) 25 MG tablet; Take 1 tablet (25 mg total) by mouth at bedtime.  Dispense: 90 tablet; Refill: 0  Tenna Delaine PA-C  [facility] at [facility] 04/04/2018 10:32 AM

## 2018-04-04 NOTE — Patient Instructions (Addendum)
Follow up with me in 3-6 months. Try hydroxyzine for sleep (start with half tablet, can increase every few nights up to a dose of 100mg ). Do not take with benadryl. Talk to gyn about SSRI and hydroxyzine.  Thank you for letting me participate in your health and well being.    If you have lab work done today you will be contacted with your lab results within the next 2 weeks.  If you have not heard from [place] then please contact us. The fastest way to get your results is to register for My Chart.   IF you received an x-ray today, you will receive an invoice from [facility]. Please contact [facility] at [phone] with questions or concerns regarding your invoice.   IF you received labwork today, you will receive an invoice from [facility]. Please contact [facility] at [phone] with questions or concerns regarding your invoice.   Our billing staff will not be able to assist you with questions regarding bills from these companies.  You will be contacted with the lab results as soon as they are available. The fastest way to get your results is to activate your My Chart account. Instructions are located on the last page of this paperwork. If you have not heard from [place] regarding the results in 2 weeks, please contact this office.

## 2018-04-05 ENCOUNTER — Encounter: Payer: Self-pay | Admitting: Physician Assistant

## 2018-05-01 ENCOUNTER — Inpatient Hospital Stay (HOSPITAL_COMMUNITY)
Admission: AD | Admit: 2018-05-01 | Discharge: 2018-05-01 | Disposition: A | Discharge status: Home / Self Care | Payer: Managed Care, Other (non HMO) | Source: Ambulatory Visit | Attending: Obstetrics and Gynecology | Admitting: Obstetrics and Gynecology

## 2018-05-01 ENCOUNTER — Other Ambulatory Visit: Payer: Self-pay

## 2018-05-01 ENCOUNTER — Encounter (HOSPITAL_COMMUNITY): Payer: Self-pay | Admitting: *Deleted

## 2018-05-01 DIAGNOSIS — Z880 Allergy status to penicillin: Secondary | ICD-10-CM | POA: Diagnosis not present

## 2018-05-01 DIAGNOSIS — O00102 Left tubal pregnancy without intrauterine pregnancy: Secondary | ICD-10-CM

## 2018-05-01 HISTORY — DX: Tachycardia, unspecified: R00.0

## 2018-05-01 LAB — CBC
HCT: 40.3 % (ref 36.0–46.0)
Hemoglobin: 13.6 g/dL (ref 12.0–15.0)
MCH: 30.8 pg (ref 26.0–34.0)
MCHC: 33.7 g/dL (ref 30.0–36.0)
MCV: 91.4 fL (ref 80.0–100.0)
Platelets: 272 K/uL (ref 150–400)
RBC: 4.41 MIL/uL (ref 3.87–5.11)
RDW: 13.3 % (ref 11.5–15.5)
WBC: 13.4 K/uL — ABNORMAL HIGH (ref 4.0–10.5)
nRBC: 0 % (ref 0.0–0.2)

## 2018-05-01 LAB — COMPREHENSIVE METABOLIC PANEL
ALBUMIN: 4.6 g/dL (ref 3.5–5.0)
ALT: 13 U/L (ref 0–44)
ANION GAP: 10 (ref 5–15)
AST: 15 U/L (ref 15–41)
Alkaline Phosphatase: 50 U/L (ref 38–126)
BUN: 13 mg/dL (ref 6–20)
CHLORIDE: 105 mmol/L (ref 98–111)
CO2: 23 mmol/L (ref 22–32)
Calcium: 9.6 mg/dL (ref 8.9–10.3)
Creatinine, Ser: 0.73 mg/dL (ref 0.44–1.00)
GFR calc Af Amer: >60 mL/min (ref >60)
GFR calc non Af Amer: >60 mL/min (ref >60)
GLUCOSE: 101 mg/dL — AB (ref 70–99)
Potassium: 4.1 mmol/L (ref 3.5–5.1)
SODIUM: 138 mmol/L (ref 135–145)
Total Bilirubin: 0.8 mg/dL (ref 0.3–1.2)
Total Protein: 7.7 g/dL (ref 6.5–8.1)

## 2018-05-01 LAB — HCG, QUANTITATIVE, PREGNANCY: HCG, BETA CHAIN, QUANT, S: 3189 m[IU]/mL — AB (ref <5)

## 2018-05-01 LAB — ABO/RH: ABO/RH(D): O POS

## 2018-05-01 MED ORDER — PROMETHAZINE HCL 25 MG PO TABS: 12.5000 mg | ORAL_TABLET | Freq: Four times a day (QID) | ORAL | 0 refills | Status: DC | PRN

## 2018-05-01 MED ORDER — METHOTREXATE INJECTION FOR WOMEN'S HOSPITAL
50.0000 mg/m2 | Freq: Once | INTRAMUSCULAR | Status: AC
  Administered 2018-05-01: 75 mg via INTRAMUSCULAR
  Filled 2018-05-01: qty 1.5

## 2018-05-01 MED FILL — PROMETHAZINE 25 MG TABLET: 25 | 7 days supply | Qty: 30 | Fill #0

## 2018-05-01 NOTE — MAU Provider Note (Signed)
Chief Complaint: Ectopic Pregnancy   None     SUBJECTIVE HPI: Dorothy Fernandez is a 32 y.o. G1P0 who presents to maternity admissions sent from the office for methotrexate for ectopic pregnancy.  Pt reports light spotting and no pain today.  She was followed in the office and had no IUP and US showed left adnexal mass, suspicious for ectopic today.  She has not tried any treatments. There are no other associated symptoms.     HPI  Past Medical History:  Diagnosis Date  . Anxiety   . Tachycardia    Past Surgical History:  Procedure Laterality Date  . TONSILLECTOMY     Social History   Socioeconomic History  . Marital status: Married    Spouse name: Not on file  . Number of children: 0  . Years of education: Not on file  . Highest education level: Doctorate  Occupational History  . Occupation: Software engineer  Social Needs  . Financial resource strain: Not hard at all  . Food insecurity:    Worry: Never true    Inability: Never true  . Transportation needs:    Medical: No    Non-medical: No  Tobacco Use  . Smoking status: Never Smoker  . Smokeless tobacco: Never Used  Substance and Sexual Activity  . Alcohol use: Not Currently    Comment: 1 X a year  . Drug use: No  . Sexual activity: Yes    Partners: Male    Birth control/protection: None  Lifestyle  . Physical activity:    Days per week: 2 days    Minutes per session: 30 min  . Stress: Very much  Relationships  . Social connections:    Talks on phone: Not on file    Gets together: Not on file    Attends religious service: Not on file    Active member of club or organization: Not on file    Attends meetings of clubs or organizations: Not on file    Relationship status: Not on file  . Intimate partner violence:    Fear of current or ex partner: Not on file    Emotionally abused: Not on file    Physically abused: Not on file    Forced sexual activity: Not on file  Other Topics Concern  . Not on file  Social  History Narrative  . Not on file   No current facility-administered medications on file prior to encounter.    Current Outpatient Medications on File Prior to Encounter  Medication Sig Dispense Refill  . busPIRone (BUSPAR) 10 MG tablet Take 1 tablet (10 mg total) by mouth 2 (two) times daily. 180 tablet 0  . hydrOXYzine (ATARAX/VISTARIL) 25 MG tablet Take 1 tablet (25 mg total) by mouth at bedtime. 90 tablet 0  . Multiple Vitamin (MULTIVITAMIN) capsule Take by mouth.    . nitrofurantoin, macrocrystal-monohydrate, (MACROBID) 100 MG capsule Take 100 mg by mouth 2 (two) times daily as needed.    . propranolol (INDERAL) 20 MG tablet Take by mouth.    . propranolol ER (INDERAL LA) 80 MG 24 hr capsule   10  . triamcinolone cream (KENALOG) 0.1 % daily.     Allergies  Allergen Reactions  . Cephalosporins Rash  . Erythromycin Rash  . Penicillins Rash  . Sulfa Antibiotics Rash  . Ceclor [Cefaclor] Hives  . Septra [Sulfamethoxazole-Trimethoprim]   . Suprax [Cefixime] Hives    ROS:  Review of Systems  Constitutional: Negative for chills, fatigue and fever.  Respiratory: Negative for shortness of breath.   Cardiovascular: Negative for chest pain.  Gastrointestinal: Negative for abdominal pain, nausea and vomiting.  Genitourinary: Positive for vaginal bleeding. Negative for difficulty urinating, dysuria, flank pain, pelvic pain, vaginal discharge and vaginal pain.  Musculoskeletal: Negative for back pain.  Neurological: Negative for dizziness and headaches.  Psychiatric/Behavioral: Negative.      I have reviewed patient's Past Medical Hx, Surgical Hx, Family Hx, Social Hx, medications and allergies.   Physical Exam   Patient Vitals for the past 24 hrs:  BP Temp Pulse Resp SpO2 Height Weight  05/01/18 1323 125/77 98.4 F (36.9 C) 83 16 100 % 5\' 3"  (1.6 m) 51.3 kg   Constitutional: Well-developed, well-nourished female in no acute distress.  HEART: normal rate, heart sounds, regular  rhythm RESP: normal effort, lung sounds clear and equal bilaterally GI: Abd soft, non-tender. Pos BS x 4 MS: Extremities nontender, no edema, normal ROM Neurologic: Alert and oriented x 4.  GU: Neg CVAT.  PELVIC EXAM: Deferrred    LAB RESULTS Results for orders placed or performed during the hospital encounter of 05/01/18 (from the past 24 hour(s))  CBC     Status: Abnormal   Collection Time: 05/01/18  1:35 PM  Result Value Ref Range   WBC 13.4 (H) 4.0 - 10.5 K/uL   RBC 4.41 3.87 - 5.11 MIL/uL   Hemoglobin 13.6 12.0 - 15.0 g/dL   HCT 40.3 36.0 - 46.0 %   MCV 91.4 80.0 - 100.0 fL   MCH 30.8 26.0 - 34.0 pg   MCHC 33.7 30.0 - 36.0 g/dL   RDW 13.3 11.5 - 15.5 %   Platelets 272 150 - 400 K/uL   nRBC 0.0 0.0 - 0.2 %  Comprehensive metabolic panel     Status: Abnormal   Collection Time: 05/01/18  1:35 PM  Result Value Ref Range   Sodium 138 135 - 145 mmol/L   Potassium 4.1 3.5 - 5.1 mmol/L   Chloride 105 98 - 111 mmol/L   CO2 23 22 - 32 mmol/L   Glucose, Bld 101 (H) 70 - 99 mg/dL   BUN 13 6 - 20 mg/dL   Creatinine, Ser 0.73 0.44 - 1.00 mg/dL   Calcium 9.6 8.9 - 10.3 mg/dL   Total Protein 7.7 6.5 - 8.1 g/dL   Albumin 4.6 3.5 - 5.0 g/dL   AST 15 15 - 41 U/L   ALT 13 0 - 44 U/L   Alkaline Phosphatase 50 38 - 126 U/L   Total Bilirubin 0.8 0.3 - 1.2 mg/dL   GFR calc non Af Amer >60 >60 mL/min   GFR calc Af Amer >60 >60 mL/min   Anion gap 10 5 - 15  ABO/Rh     Status: None (Preliminary result)   Collection Time: 05/01/18  1:35 PM  Result Value Ref Range   ABO/RH(D)      O POS Performed at [facility], [address] [place], [place] [address]     --/--/O POS Performed at [facility], [address] [place], [place] [address]  (11/13 1335)  IMAGING No results found.      MAU Management/MDM: Dr Sandford Craze called to report pt presentation including [place] results in office.  [place] report faxed to MAU for review.  Ordered labs and reviewed results.  Hcg  pending.  Kidney and liver labs normal so MTX dose ordered and given in MAU today.  Pt to f/u on Day 4, Saturday, in MAU for hcg  and on Day 7, Tuesday, in the office. Return to MAU sooner as needed for emergencies.   Pt discharged with strict ectopic precautions.  ASSESSMENT 1. Left tubal pregnancy without intrauterine pregnancy     PLAN Discharge home Allergies as of 05/01/2018      Reactions   Cephalosporins Rash   Erythromycin Rash   Penicillins Rash   Sulfa Antibiotics Rash   Ceclor [cefaclor] Hives   Septra [sulfamethoxazole-trimethoprim]    Suprax [cefixime] Hives      Medication List    TAKE these medications   busPIRone 10 MG tablet Commonly known as:  BUSPAR Take 1 tablet (10 mg total) by mouth 2 (two) times daily.   hydrOXYzine 25 MG tablet Commonly known as:  ATARAX/VISTARIL Take 1 tablet (25 mg total) by mouth at bedtime.   multivitamin capsule Take by mouth.   nitrofurantoin (macrocrystal-monohydrate) 100 MG capsule Commonly known as:  MACROBID Take 100 mg by mouth 2 (two) times daily as needed.   promethazine 25 MG tablet Commonly known as:  PHENERGAN Take 0.5-1 tablets (12.5-25 mg total) by mouth every 6 (six) hours as needed for nausea.   propranolol 20 MG tablet Commonly known as:  INDERAL Take by mouth.   propranolol ER 80 MG 24 hr capsule Commonly known as:  INDERAL LA   triamcinolone cream 0.1 % Commonly known as:  KENALOG daily.      Follow-up Information    Bovard-Stuckert, Jody, MD Follow up.   Specialty:  Obstetrics and Gynecology Why:  On Saturday, 05/04/18 to MAU for labwork and on Tuesday, 05/07/18 to the office for labs. Return to MAU at any time with worsening pain or bleeding. Contact information: [place] [address] [place] [address] [phone]           Fatima Blank Certified Nurse-Midwife 05/01/2018  5:11 PM

## 2018-05-01 NOTE — MAU Note (Signed)
Pt sent from Dr Roe Rutherford office for methotrexate for ectopic pregnancy. Denies any pain

## 2018-05-04 ENCOUNTER — Inpatient Hospital Stay (HOSPITAL_COMMUNITY)
Admission: AD | Admit: 2018-05-04 | Discharge: 2018-05-04 | Disposition: A | Discharge status: Home / Self Care | Payer: Managed Care, Other (non HMO) | Source: Ambulatory Visit | Attending: Obstetrics and Gynecology | Admitting: Obstetrics and Gynecology

## 2018-05-04 DIAGNOSIS — O00102 Left tubal pregnancy without intrauterine pregnancy: Secondary | ICD-10-CM | POA: Insufficient documentation

## 2018-05-04 LAB — HCG, QUANTITATIVE, PREGNANCY: hCG, Beta Chain, Quant, S: 2890 m[IU]/mL — ABNORMAL HIGH (ref <5)

## 2018-05-04 NOTE — MAU Note (Addendum)
Dorothy Fernandez is a 32 y.o. here in MAU reporting: for follow up HCG, day 3 after methotrexate.  Pain score: denies Vaginal bleeding: denies Patient reports that she was unaware she needed to stay. Discussed with Sam CNM and best contact number obtained. [phone]. RN confirmed with patient next lab draw is on Tuesday. Patient verbalized understanding. Vitals:   05/04/18 1542  BP: 129/83  Pulse: 78  Resp: 17  Temp: 97.9 F (36.6 C)  SpO2: 100%     Lab orders placed from triage: HCG

## 2018-05-04 NOTE — MAU Provider Note (Signed)
History     CSN: [id number]  Arrival date and time: 05/04/18 1510   None     Chief Complaint  Patient presents with  . Follow-up   HPI  Dorothy Fernandez is a 32 y.o. G1P0 patient who presents to MAU for Day 3 labs after Methotrexate administration 05/01/18. Patient denies complaints or concerns at this time. Denies cramping or bleeding.  OB History    Gravida  1   Para  0   Term      Preterm      AB      Living  0     SAB      TAB      Ectopic      Multiple      Live Births              Past Medical History:  Diagnosis Date  . Anxiety   . Tachycardia     Past Surgical History:  Procedure Laterality Date  . TONSILLECTOMY      Family History  Problem Relation Age of Onset  . Hyperlipidemia Mother   . Cancer Maternal Grandmother        Cervical  . Diabetes Maternal Grandfather     Social History   Tobacco Use  . Smoking status: Never Smoker  . Smokeless tobacco: Never Used  Substance Use Topics  . Alcohol use: Not Currently    Comment: 1 X a year  . Drug use: No    Allergies:  Allergies  Allergen Reactions  . Cephalosporins Rash  . Erythromycin Rash  . Penicillins Rash  . Sulfa Antibiotics Rash  . Ceclor [Cefaclor] Hives  . Septra [Sulfamethoxazole-Trimethoprim]   . Suprax [Cefixime] Hives    No medications prior to admission.    Review of Systems  Gastrointestinal: Negative for abdominal pain, nausea and vomiting.  Genitourinary: Negative for vaginal bleeding, vaginal discharge and vaginal pain.  Neurological: Negative for dizziness.  All other systems reviewed and are negative.  Physical Exam   Blood pressure 129/83, pulse 78, temperature 97.9 F (36.6 C), temperature source Oral, resp. rate 17, SpO2 100 %.  Physical Exam  Nursing note and vitals reviewed. Constitutional: She is oriented to person, place, and time. She appears well-developed and well-nourished.  Cardiovascular: Normal rate.  Respiratory: Effort  normal.  GI: Soft. She exhibits no distension.  Neurological: She is alert and oriented to person, place, and time. She has normal reflexes.  Skin: Skin is warm and dry.  Psychiatric: She has a normal mood and affect. Her behavior is normal. Judgment and thought content normal.    MAU Course/MDM   Methotrexate Treatment Protocol for Ectopic Pregnancy   Treatment day  Single dose protocol   1  05/01/18 hCG.  Administer Methotrexate 50 mg/m2 body surface area IM  4  05/04/18 hCG  7  05/07/18 hCG  If <15 percent hCG decline from day 4 to 7, give additional dose of methotrexate 50 mg/m2 IM  If ?15 percent hCG decline from day 4 to 7, draw hCG weekly until undetectable   Patient Vitals for the past 24 hrs:  BP Temp Temp src Pulse Resp SpO2  05/04/18 1542 129/83 97.9 F (36.6 C) Oral 78 17 100 %     Assessment and Plan  --32 y.o. G1P0 patient s/p Methotrexate administration 11/13 --hCG pending. Pt did not realize she would be asked to wait for hCG results --Patient to present to clinic Tuesday 05/07/18 for Day 7 labs --  Discharge home in stable condition  Patient discharged prior to results of hCG. Called later the same day (11/16 at 1726) to discuss results and next steps. Patient understands plan to follow up in clinic Tuesday   [place], [place] 05/04/2018, 5:25 PM

## 2018-05-06 MED FILL — PROPRANOLOL ER 80 MG CAP: 80 | 30 days supply | Qty: 30 | Fill #3

## 2018-06-03 MED FILL — PROPRANOLOL ER 80 MG CAP: 80 | 30 days supply | Qty: 30 | Fill #4

## 2018-06-19 NOTE — L&D Delivery Note (Signed)
DELIVERY NOTE  Pt complete and at +2 station with urge to push. Epidural controlling pain. During pushing with good maternal effort, deep recurrent variable decelerations note with slower return to baseline. Decision made to expedite second stage of labor via operative vaginal delivery. NICU and OR staff notified, additional personal attended.   Risks and benefits discussed in detail.  Risks include, but are not limited to the risks of anesthesia, bleeding, infection, damage to maternal tissues, fetal cephalhematoma.  There is also the risk of inability to effect vaginal delivery of the head, or shoulder dystocia that cannot be resolved by established maneuvers, leading to the need for emergency cesarean section.   NICU team present at 1615. Position confirmed OA, low vacuum delivery performed Mity Vac w/ Bell head applied at 1615 3 pulls followed to bring fetal occiput to outlet position Popoff and reapplication x1 @ [id number]  Pt pushed and delivered a viable female infant in direct OA position. Anterior and posterior shoulders spontaneously delivered with next two pushes; body easily followed next. Loose nuchal x1, easily reduced. Infant placed on mothers abdomen and bulb suction of mouth and nose performed. Cord was then clamped and cut by FOB. Cord blood obtained, 3VC. Baby had a vigorous spontaneous cry noted. Placenta then delivered at 1622 intact. Fundal massage performed and pitocin per protocol. Fundus firm.  The following lacerations were noted:     Bilateral sulcal laceration - both repaired in 2 layers with running locked 3-0 vicryl     2nd degree perineal - repaired in routine fashion w/ 2-0 vicryl suture  Mother and baby stable. Counts correct   Infant time: 1618 Gender: female Placenta time: 1622 Apgars: 9/9 Weight: pending skin-to-skin  Excellent hemostasis noted at end of procedure. RVS examined via digital rectal exam after repair, and no suture nor collection was palpated.

## 2018-07-01 ENCOUNTER — Other Ambulatory Visit: Payer: Self-pay | Admitting: Physician Assistant

## 2018-07-01 DIAGNOSIS — F411 Generalized anxiety disorder: Secondary | ICD-10-CM

## 2018-07-02 ENCOUNTER — Encounter: Payer: Self-pay | Admitting: Physician Assistant

## 2018-07-04 ENCOUNTER — Other Ambulatory Visit: Payer: Self-pay | Admitting: *Deleted

## 2018-07-04 DIAGNOSIS — F411 Generalized anxiety disorder: Secondary | ICD-10-CM

## 2018-07-04 MED ORDER — BUSPIRONE HCL 10 MG PO TABS: 10.0000 mg | ORAL_TABLET | Freq: Two times a day (BID) | ORAL | 0 refills | Status: DC

## 2018-07-04 MED FILL — busPIRone HCL 10 MG TABS: 10 | 90 days supply | Qty: 180 | Fill #0

## 2018-07-04 MED FILL — PROPRANOLOL ER 80 MG CAP: 80 | 30 days supply | Qty: 30 | Fill #5

## 2018-07-11 LAB — HM PAP SMEAR: HPV, high-risk: NEGATIVE

## 2018-07-17 MED FILL — NITROFURANTOIN MCR 100 MG C: 100 | 30 days supply | Qty: 30 | Fill #0

## 2018-08-04 MED FILL — PROPRANOLOL ER 80 MG CAP: 80 | 30 days supply | Qty: 30 | Fill #6

## 2018-08-30 MED FILL — PROPRANOLOL ER 80 MG CAP: 80 | 30 days supply | Qty: 30 | Fill #7

## 2018-10-01 MED FILL — NITROFURANTOIN MCR 100 MG C: 100 | 30 days supply | Qty: 30 | Fill #1

## 2018-10-01 MED FILL — PROPRANOLOL HCL ER 80 MG CP: 80 | 30 days supply | Qty: 30 | Fill #8

## 2018-10-29 MED FILL — PROPRANOLOL HCL ER 80 MG CP: 80 | 30 days supply | Qty: 30 | Fill #9

## 2018-11-06 ENCOUNTER — Other Ambulatory Visit: Payer: Self-pay

## 2018-11-06 ENCOUNTER — Encounter: Payer: Self-pay | Admitting: Registered Nurse

## 2018-11-06 ENCOUNTER — Ambulatory Visit: Payer: Managed Care, Other (non HMO) | Admitting: Registered Nurse

## 2018-11-06 VITALS — BP 102/68 | HR 95 | Temp 98.0°F | Resp 16 | Ht 63.0 in | Wt 113.0 lb

## 2018-11-06 DIAGNOSIS — F411 Generalized anxiety disorder: Secondary | ICD-10-CM | POA: Diagnosis not present

## 2018-11-06 DIAGNOSIS — Z7689 Persons encountering health services in other specified circumstances: Secondary | ICD-10-CM

## 2018-11-06 DIAGNOSIS — Z3A01 Less than 8 weeks gestation of pregnancy: Secondary | ICD-10-CM

## 2018-11-06 DIAGNOSIS — Z3490 Encounter for supervision of normal pregnancy, unspecified, unspecified trimester: Secondary | ICD-10-CM | POA: Insufficient documentation

## 2018-11-06 MED ORDER — BUSPIRONE HCL 10 MG PO TABS: 10.0000 mg | ORAL_TABLET | Freq: Two times a day (BID) | ORAL | 0 refills | Status: DC

## 2018-11-06 MED FILL — busPIRone HCL 10 MG TABS: 10 | 90 days supply | Qty: 180 | Fill #0

## 2018-11-06 NOTE — Patient Instructions (Addendum)
If you have lab work done today you will be contacted with your lab results within the next 2 weeks.  If you have not heard from [place] then please contact us. The fastest way to get your results is to register for My Chart.   IF you received an x-ray today, you will receive an invoice from [facility]. Please contact [facility] at [phone] with questions or concerns regarding your invoice.   IF you received labwork today, you will receive an invoice from [facility]. Please contact [facility] at [phone] with questions or concerns regarding your invoice.   Our billing staff will not be able to assist you with questions regarding bills from these companies.  You will be contacted with the lab results as soon as they are available. The fastest way to get your results is to activate your My Chart account. Instructions are located on the last page of this paperwork. If you have not heard from [place] regarding the results in 2 weeks, please contact this office.      Abdominal Pain During Pregnancy  Abdominal pain is common during pregnancy, and has many possible causes. Some causes are more serious than others, and sometimes the cause is not known. Abdominal pain can be a sign that labor is starting. It can also be caused by normal growth and stretching of muscles and ligaments during pregnancy. Always tell your health care provider if you have any abdominal pain. Follow these instructions at home:  Do not have sex or put anything in your vagina until your pain goes away completely.  Get plenty of rest until your pain improves.  Drink enough fluid to keep your urine pale yellow.  Take over-the-counter and prescription medicines only as told by your health care provider.  Keep all follow-up visits as told by your health care provider. This is important. Contact a health care provider if:  Your pain continues or gets worse after resting.  You have lower abdominal pain  that: ? Comes and goes at regular intervals. ? Spreads to your back. ? Is similar to menstrual cramps.  You have pain or burning when you urinate. Get help right away if:  You have a fever or chills.  You have vaginal bleeding.  You are leaking fluid from your vagina.  You are passing tissue from your vagina.  You have vomiting or diarrhea that lasts for more than 24 hours.  Your baby is moving less than usual.  You feel very weak or faint.  You have shortness of breath.  You develop severe pain in your upper abdomen. Summary  Abdominal pain is common during pregnancy, and has many possible causes.  If you experience abdominal pain during pregnancy, tell your health care provider right away.  Follow your health care provider's home care instructions and keep all follow-up visits as directed. This information is not intended to replace advice given to you by your health care provider. Make sure you discuss any questions you have with your health care provider. Document Released: 06/05/2005 Document Revised: 09/07/2016 Document Reviewed: 09/07/2016 Elsevier Interactive Patient Education  2019 [place].  Activity Restriction During Pregnancy Your health care provider may recommend specific activity restrictions during pregnancy for a variety of reasons. Activity restriction may require that you limit activities that require great effort, such as exercise, lifting, or sex. The type of activity restriction will vary for each person, depending on your risk or the problems you are having. Activity restriction may be  recommended for a period of time until your baby is delivered. Why are activity restrictions recommended? Activity restriction may be recommended if:  Your placenta is partially or completely covering the opening of your cervix (placenta previa).  There is bleeding between the wall of the uterus and the amniotic sac in the first trimester of pregnancy  (subchorionic hemorrhage).  You went into labor too early (preterm labor).  You have a history of miscarriage.  You have a condition that causes high blood pressure during pregnancy (preeclampsia or eclampsia).  You are pregnant with more than one baby.  Your baby is not growing well. What are the risks? The risks depend on your specific restriction. Strict bed rest has the most physical and emotional risks and is no longer routinely recommended. Risks of strict bed rest include:  Loss of muscle conditioning from not moving.  Blood clots.  Social isolation.  Depression.  Loss of income. Talk with your health care team about activity restriction to decide if it is best for you and your baby. Even if you are having problems during your pregnancy, you may be able to continue with normal levels of activity with careful monitoring by your health care team. Follow these instructions at home: If needed, based on your overall health and the health of your baby, your health care provider will decide which type of activity restriction is right for you. Activity restrictions may include:  Not lifting anything heavier than 10 pounds (4.5 kg).  Avoiding activities that take a lot of physical effort.  No lifting or straining.  Resting in a sitting position or lying down for periods of time during the day. Pelvic rest may be recommended along with activity restrictions. If pelvic rest is recommended, then:  Do not have sex, an orgasm, or use sexual stimulation.  Do not use tampons. Do not douche. Do not put anything into your vagina.  Do not lift anything that is heavier than 10 lb (4.5 kg).  Avoid activities that require a lot of effort.  Avoid any activity in which your pelvic muscles could become strained, such as squatting. Questions to ask your health care provider  Why is my activity being limited?  How will activity restrictions affect my body?  Why is rest helpful for me  and my baby?  What activities can I do?  When can I return to normal activities? When should I seek immediate medical care? Seek immediate medical care if you have:  Vaginal bleeding.  Vaginal discharge.  Cramping pain in your lower abdomen.  Regular contractions.  A low, dull backache. Summary  Your health care provider may recommend specific activity restrictions during pregnancy for a variety of reasons.  Activity restriction may require that you limit activities such as exercise, lifting, sex, or any other activity that requires great effort.  Discuss the risks and benefits of activity restriction with your health care team to decide if it is best for you and your baby.  Contact your health care provider right away if you think you are having contractions, or if you notice vaginal bleeding, discharge, or cramping. This information is not intended to replace advice given to you by your health care provider. Make sure you discuss any questions you have with your health care provider. Document Released: 09/30/2010 Document Revised: 09/25/2017 Document Reviewed: 09/25/2017 Elsevier Interactive Patient Education  2019 [place].  Alcohol Use During Pregnancy  Drinking alcohol while you are pregnant can harm your unborn baby and cause  long-term problems for your baby after birth. When you drink alcohol, it passes through your placenta to your baby. A baby's liver cannot process alcohol like an adult's liver can, because it is not mature enough. How does this affect me? Drinking alcohol during pregnancy can increase your risk of:  Miscarriage.  Stillbirth.  Premature delivery.  Injury from falls.  Domestic violence.  Mood disorders, such as depression and postpartum depression. How does this affect my baby? Drinking alcohol during pregnancy can cause fetal alcohol spectrum disorder in your unborn baby (fetus). You are at the highest risk of having a baby affected by  alcohol if you have 7 or more drinks a week through most of your pregnancy. Fetal alcohol spectrum disorder (FASD) is a group of birth defects that can occur in a child whose mother drank excessive amounts of alcohol during pregnancy. Defects can include:  Behavior and attention problems.  Disorders of the brain and spinal cord (central nervous system disorders).  Problems with the heart, kidneys, and bones.  Decreased growth before and after birth.  Learning problems and intellectual disabilities.  Speech and language problems.  Vision and hearing problems.  Smaller head size than normal.  Sleep and sucking disorders.  Changes in the shape of the face. The only way to prevent the condition is to stop drinking alcohol the moment you realize that you are pregnant. Can I have any alcohol during my pregnancy? No amount of alcohol is safe during pregnancy. Because of this, it is best not to consume any alcohol at all. Do not drink alcohol if:  You plan to become pregnant.  You think you may be pregnant. If you are struggling not to drink, ask your health care provider for help. If you have a drinking problem, the sooner you get help to stop, the better your chance of having a healthy baby. Where to find more information  [employer] on Alcohol Abuse and Alcoholism: http://www.bradshaw.com/  Substance Abuse and Mental Health Services Administration: www.fascenter.SamedayNews.com.cy Summary  Drinking alcohol during your pregnancy can cause life-long harm to your developing baby.  No amount of alcohol is safe during pregnancy.  Stop drinking alcohol if you think you are pregnant or could become pregnant.  If you are pregnant and you are not able to stop drinking alcohol, talk with your health care provider right away. This information is not intended to replace advice given to you by your health care provider. Make sure you discuss any questions you have with your health care  provider. Document Released: 04/02/2007 Document Revised: 02/08/2017 Document Reviewed: 02/08/2017 Elsevier Interactive Patient Education  2019 [employer]  Back Pain in Pregnancy Back pain during pregnancy is common. Back pain may be caused by several factors that are related to changes during your pregnancy. Follow these instructions at home: Managing pain, stiffness, and swelling      If directed, for sudden (acute) back pain, put ice on the painful area. ? Put ice in a plastic bag. ? Place a towel between your skin and the bag. ? Leave the ice on for 20 minutes, 2-3 times per day.  If directed, apply heat to the affected area before you exercise. Use the heat source that your health care provider recommends, such as a moist heat pack or a heating pad. ? Place a towel between your skin and the heat source. ? Leave the heat on for 20-30 minutes. ? Remove the heat if your skin turns bright red. This is especially important  if you are unable to feel pain, heat, or cold. You may have a greater risk of getting burned.  If directed, massage the affected area. Activity  Exercise as told by your health care provider. Gentle exercise is the best way to prevent or manage back pain.  Listen to your body when lifting. If lifting hurts, ask for help or bend your knees. This uses your leg muscles instead of your back muscles.  Squat down when picking up something from the floor. Do not bend over.  Only use bed rest for short periods as told by your health care provider. Bed rest should only be used for the most severe episodes of back pain. Standing, sitting, and lying down  Do not stand in one place for long periods of time.  Use good posture when sitting. Make sure your head rests over your shoulders and is not hanging forward. Use a pillow on your lower back if necessary.  Try sleeping on your side, preferably the left side, with a pregnancy support pillow or 1-2 regular pillows  between your legs. ? If you have back pain after a night's rest, your bed may be too soft. ? A firm mattress may provide more support for your back during pregnancy. General instructions  Do not wear high heels.  Eat a healthy diet. Try to gain weight within your health care provider's recommendations.  Use a maternity girdle, elastic sling, or back brace as told by your health care provider.  Take over-the-counter and prescription medicines only as told by your health care provider.  Work with a physical therapist or massage therapist to find ways to manage back pain. Acupuncture or massage therapy may be helpful.  Keep all follow-up visits as told by your health care provider. This is important. Contact a health care provider if:  Your back pain interferes with your daily activities.  You have increasing pain in other parts of your body. Get help right away if:  You develop numbness, tingling, weakness, or problems with the use of your arms or legs.  You develop severe back pain that is not controlled with medicine.  You have a change in bowel or bladder control.  You develop shortness of breath, dizziness, or you faint.  You develop nausea, vomiting, or sweating.  You have back pain that is a rhythmic, cramping pain similar to labor pains. Labor pain is usually 1-2 minutes apart, lasts for about 1 minute, and involves a bearing down feeling or pressure in your pelvis.  You have back pain and your water breaks or you have vaginal bleeding.  You have back pain or numbness that travels down your leg.  Your back pain developed after you fell.  You develop pain on one side of your back.  You see blood in your urine.  You develop skin blisters in the area of your back pain. Summary  Back pain may be caused by several factors that are related to changes during your pregnancy.  Follow instructions as told by your health care provider for managing pain, stiffness, and  swelling.  Exercise as told by your health care provider. Gentle exercise is the best way to prevent or manage back pain.  Take over-the-counter and prescription medicines only as told by your health care provider.  Keep all follow-up visits as told by your health care provider. This is important. This information is not intended to replace advice given to you by your health care provider. Make sure you discuss any  questions you have with your health care provider. Document Released: 09/13/2005 Document Revised: 11/21/2017 Document Reviewed: 11/21/2017 Elsevier Interactive Patient Education  2019 [employer]. Exercise During Pregnancy For people of all ages, exercise is an important part of being healthy. Exercise improves heart and lung function and helps to maintain strength, flexibility, and a healthy body weight. Exercise also boosts energy levels and elevates mood. For most women, maintaining an exercise routine throughout pregnancy is recommended. It is only on rare occasions and with certain medical conditions or pregnancy complications that women may be asked to limit or avoid exercise during pregnancy. What are some other benefits to exercising during pregnancy? Along with maintaining strength and flexibility, exercising throughout pregnancy can help to:  Keep strength in muscles that are very important during labor and childbirth.  Decrease low back pain during pregnancy.  Decrease the risk of developing gestational diabetes mellitus (GDM).  Improve blood sugar (glucose) control for women who have GDM.  Decrease the risk of developing preeclampsia. This is a serious condition that causes high blood pressure along with other symptoms, such as swelling and headaches.  Decrease the risk of cesarean delivery.  Speed up the recovery after giving birth. How often should I exercise? Unless your health care provider gives you different instructions, you should try to exercise on  most days or all days of the week. In general, try to exercise with moderate intensity for about 150 minutes per week. This can be spread out across several days, such as exercising for 30 minutes per day on 5 days of each week. You can tell that you are exercising at a moderate intensity if you have a higher heart rate and faster breathing, but you are still able to hold a conversation. What types of moderate-intensity exercise are recommended during pregnancy? There are many types of exercise that are safe for you to do during pregnancy. Unless your health care provider gives you different instructions, do a variety of exercises that safely increase your heart and breathing (cardiopulmonary) rates and help you to build and maintain muscle strength (strength training). You should always be able to talk in full sentences while exercising during pregnancy. Some examples of exercising that is safe to do during pregnancy include:  Brisk walking or hiking.  Swimming.  Water aerobics.  Riding a stationary bike.  Strength training.  Modified yoga or Pilates. Tell your instructor that you are pregnant. Avoid overstretching and avoid lying on your back for long periods of time.  Running or jogging. Only choose this type of exercise if: ? You ran or jogged regularly before your pregnancy. ? You can run or jog and still talk in complete sentences. What types of exercise should I not do during pregnancy? Depending on your level of fitness and whether you exercised regularly before your pregnancy, you may be advised to limit vigorous-intensity exercise during your pregnancy. You can tell that you are exercising at a vigorous intensity if you are breathing much harder and faster and cannot hold a conversation while exercising. Some examples of exercising that you should avoid during pregnancy include:  Contact sports.  Activities that place you at risk for falling on or being hit in the belly, such as  downhill skiing, water skiing, surfing, rock climbing, cycling, gymnastics, and horseback riding.  Scuba diving.  Sky diving.  Yoga or Pilates in a room that is heated to extreme temperatures ("hot yoga" or "hot Pilates").  Jogging or running, unless you ran or jogged regularly  before your pregnancy. While jogging or running, you should always be able to talk in full sentences. Do not run or jog so vigorously that you are unable to have a conversation.  If you are not used to exercising at elevation (more than 6,000 feet above sea level), do not do so during your pregnancy. When should I avoid exercising during pregnancy? Certain medical conditions can make it unsafe to exercise during pregnancy, or they may increase your risk of miscarriage or early labor and birth. Some of these conditions include:  Some types of heart disease.  Some types of lung disease.  Placenta previa. This is when the placenta partially or completely covers the opening of the uterus (cervix).  Frequent bleeding from the vagina during your pregnancy.  Incompetent cervix. This is when your cervix does not remain as tightly closed during pregnancy as it should.  Premature labor.  Ruptured membranes. This is when the protective sac (amniotic sac) opens up and amniotic fluid leaks from your vagina.  Severely low blood count (anemia).  Preeclampsia or pregnancy-caused high blood pressure.  Carrying more than one baby (multiple gestation) and having an additional risk of early labor.  Poorly controlled diabetes.  Being severely underweight or severely overweight.  Intrauterine growth restriction. This is when your baby's growth and development during pregnancy are slower than expected.  Other medical conditions. Ask your health care provider if any apply to you. What else should I know about exercising during pregnancy? You should take these precautions while exercising during pregnancy:  Avoid  overheating. ? Wear loose-fitting, breathable clothes. ? Do not exercise in very high temperatures.  Avoid dehydration. Drink enough water before, during, and after exercise to keep your urine clear or pale yellow.  Avoid overstretching. Because of hormone changes during pregnancy, it is easy to overstretch muscles, tendons, and ligaments during pregnancy.  Start slowly and ask your health care provider to recommend types of exercise that are safe for you, if exercising regularly is new for you. Pregnancy is not a time for exercising to lose weight. When should I seek medical care? You should stop exercising and call your health care provider if you have any unusual symptoms, such as:  Mild uterine contractions or abdominal cramping.  Dizziness that does not improve with rest. When should I seek immediate medical care? You should stop exercising and call your local emergency services (911 in the [place] if you have any unusual symptoms, such as:  Sudden, severe pain in your low back or your belly.  Uterine contractions or abdominal cramping that do not improve with rest.  Chest pain.  Bleeding or fluid leaking from your vagina.  Shortness of breath. This information is not intended to replace advice given to you by your health care provider. Make sure you discuss any questions you have with your health care provider. Document Released: 06/05/2005 Document Revised: 11/03/2015 Document Reviewed: 08/13/2014 Elsevier Interactive Patient Education  2019 [place] of Pregnancy The first trimester of pregnancy is from week 1 until the end of week 13 (months 1 through 3). A week after a sperm fertilizes an egg, the egg will implant on the wall of the uterus. This embryo will begin to develop into a baby. Genes from you and your partner will form the baby. The female genes will determine whether the baby will be a boy or a girl. At 6-8 weeks, the eyes and face will be  formed, and the heartbeat can be seen  on ultrasound. At the end of 12 weeks, all the baby's organs will be formed. Now that you are pregnant, you will want to do everything you can to have a healthy baby. Two of the most important things are to get good prenatal care and to follow your health care provider's instructions. Prenatal care is all the medical care you receive before the baby's birth. This care will help prevent, find, and treat any problems during the pregnancy and childbirth. Body changes during your first trimester Your body goes through many changes during pregnancy. The changes vary from woman to woman.  You may gain or lose a couple of pounds at first.  You may feel sick to your stomach (nauseous) and you may throw up (vomit). If the vomiting is uncontrollable, call your health care provider.  You may tire easily.  You may develop headaches that can be relieved by medicines. All medicines should be approved by your health care provider.  You may urinate more often. Painful urination may mean you have a bladder infection.  You may develop heartburn as a result of your pregnancy.  You may develop constipation because certain hormones are causing the muscles that push stool through your intestines to slow down.  You may develop hemorrhoids or swollen veins (varicose veins).  Your breasts may begin to grow larger and become tender. Your nipples may stick out more, and the tissue that surrounds them (areola) may become darker.  Your gums may bleed and may be sensitive to brushing and flossing.  Dark spots or blotches (chloasma, mask of pregnancy) may develop on your face. This will likely fade after the baby is born.  Your menstrual periods will stop.  You may have a loss of appetite.  You may develop cravings for certain kinds of food.  You may have changes in your emotions from day to day, such as being excited to be pregnant or being concerned that something may go  wrong with the pregnancy and baby.  You may have more vivid and strange dreams.  You may have changes in your hair. These can include thickening of your hair, rapid growth, and changes in texture. Some women also have hair loss during or after pregnancy, or hair that feels dry or thin. Your hair will most likely return to normal after your baby is born. What to expect at prenatal visits During a routine prenatal visit:  You will be weighed to make sure you and the baby are growing normally.  Your blood pressure will be taken.  Your abdomen will be measured to track your baby's growth.  The fetal heartbeat will be listened to between weeks 10 and 14 of your pregnancy.  Test results from any previous visits will be discussed. Your health care provider may ask you:  How you are feeling.  If you are feeling the baby move.  If you have had any abnormal symptoms, such as leaking fluid, bleeding, severe headaches, or abdominal cramping.  If you are using any tobacco products, including cigarettes, chewing tobacco, and electronic cigarettes.  If you have any questions. Other tests that may be performed during your first trimester include:  Blood tests to find your blood type and to check for the presence of any previous infections. The tests will also be used to check for low iron levels (anemia) and protein on red blood cells (Rh antibodies). Depending on your risk factors, or if you previously had diabetes during pregnancy, you may have tests to check  for high blood sugar that affects pregnant women (gestational diabetes).  Urine tests to check for infections, diabetes, or protein in the urine.  An ultrasound to confirm the proper growth and development of the baby.  Fetal screens for spinal cord problems (spina bifida) and Down syndrome.  HIV (human immunodeficiency virus) testing. Routine prenatal testing includes screening for HIV, unless you choose not to have this test.  You  may need other tests to make sure you and the baby are doing well. Follow these instructions at home: Medicines  Follow your health care provider's instructions regarding medicine use. Specific medicines may be either safe or unsafe to take during pregnancy.  Take a prenatal vitamin that contains at least 600 micrograms (mcg) of folic acid.  If you develop constipation, try taking a stool softener if your health care provider approves. Eating and drinking   Eat a balanced diet that includes fresh fruits and vegetables, whole grains, good sources of protein such as meat, eggs, or tofu, and low-fat dairy. Your health care provider will help you determine the amount of weight gain that is right for you.  Avoid raw meat and uncooked cheese. These carry germs that can cause birth defects in the baby.  Eating four or five small meals rather than three large meals a day may help relieve nausea and vomiting. If you start to feel nauseous, eating a few soda crackers can be helpful. Drinking liquids between meals, instead of during meals, also seems to help ease nausea and vomiting.  Limit foods that are high in fat and processed sugars, such as fried and sweet foods.  To prevent constipation: ? Eat foods that are high in fiber, such as fresh fruits and vegetables, whole grains, and beans. ? Drink enough fluid to keep your urine clear or pale yellow. Activity  Exercise only as directed by your health care provider. Most women can continue their usual exercise routine during pregnancy. Try to exercise for 30 minutes at least 5 days a week. Exercising will help you: ? Control your weight. ? Stay in shape. ? Be prepared for labor and delivery.  Experiencing pain or cramping in the lower abdomen or lower back is a good sign that you should stop exercising. Check with your health care provider before continuing with normal exercises.  Try to avoid standing for long periods of time. Move your legs  often if you must stand in one place for a long time.  Avoid heavy lifting.  Wear low-heeled shoes and practice good posture.  You may continue to have sex unless your health care provider tells you not to. Relieving pain and discomfort  Wear a good support bra to relieve breast tenderness.  Take warm sitz baths to soothe any pain or discomfort caused by hemorrhoids. Use hemorrhoid cream if your health care provider approves.  Rest with your legs elevated if you have leg cramps or low back pain.  If you develop varicose veins in your legs, wear support hose. Elevate your feet for 15 minutes, 3-4 times a day. Limit salt in your diet. Prenatal care  Schedule your prenatal visits by the twelfth week of pregnancy. They are usually scheduled monthly at first, then more often in the last 2 months before delivery.  Write down your questions. Take them to your prenatal visits.  Keep all your prenatal visits as told by your health care provider. This is important. Safety  Wear your seat belt at all times when driving.  Make a  list of emergency phone numbers, including numbers for family, friends, the hospital, and police and fire departments. General instructions  Ask your health care provider for a referral to a local prenatal education class. Begin classes no later than the beginning of month 6 of your pregnancy.  Ask for help if you have counseling or nutritional needs during pregnancy. Your health care provider can offer advice or refer you to specialists for help with various needs.  Do not use hot tubs, steam rooms, or saunas.  Do not douche or use tampons or scented sanitary pads.  Do not cross your legs for long periods of time.  Avoid cat litter boxes and soil used by cats. These carry germs that can cause birth defects in the baby and possibly loss of the fetus by miscarriage or stillbirth.  Avoid all smoking, herbs, alcohol, and medicines not prescribed by your health care  provider. Chemicals in these products affect the formation and growth of the baby.  Do not use any products that contain nicotine or tobacco, such as cigarettes and e-cigarettes. If you need help quitting, ask your health care provider. You may receive counseling support and other resources to help you quit.  Schedule a dentist appointment. At home, brush your teeth with a soft toothbrush and be gentle when you floss. Contact a health care provider if:  You have dizziness.  You have mild pelvic cramps, pelvic pressure, or nagging pain in the abdominal area.  You have persistent nausea, vomiting, or diarrhea.  You have a bad smelling vaginal discharge.  You have pain when you urinate.  You notice increased swelling in your face, hands, legs, or ankles.  You are exposed to fifth disease or chickenpox.  You are exposed to [place] measles (rubella) and have never had it. Get help right away if:  You have a fever.  You are leaking fluid from your vagina.  You have spotting or bleeding from your vagina.  You have severe abdominal cramping or pain.  You have rapid weight gain or loss.  You vomit blood or material that looks like coffee grounds.  You develop a severe headache.  You have shortness of breath.  You have any kind of trauma, such as from a fall or a car accident. Summary  The first trimester of pregnancy is from week 1 until the end of week 13 (months 1 through 3).  Your body goes through many changes during pregnancy. The changes vary from woman to woman.  You will have routine prenatal visits. During those visits, your health care provider will examine you, discuss any test results you may have, and talk with you about how you are feeling. This information is not intended to replace advice given to you by your health care provider. Make sure you discuss any questions you have with your health care provider. Document Released: 05/30/2001 Document Revised:  05/17/2016 Document Reviewed: 05/17/2016 Elsevier Interactive Patient Education  2019 [place] [employer] Gain During Pregnancy, Adult A certain amount of weight gain during pregnancy is normal and healthy. How much weight you should gain depends on your overall health and a measurement called BMI (body mass index). BMI is an estimate of your body fat based on your height and weight. You can use an [employer] to figure out your BMI, or you can ask your health care provider to calculate it for you at your next visit. Your recommended pregnancy weight gain is based on your pre-pregnancy BMI. General guidelines  for a healthy total weight gain during pregnancy are listed below. If your BMI at or before the start of your pregnancy is:  Less than 18.5 (underweight), you should gain 28-40 lb (13-18 kg).  18.5-24.9 (normal weight), you should gain 25-35 lb (11-16 kg).  25-29.9 (overweight), you should gain 15-25 lb (7-11 kg).  30 or higher (obese), you should gain 11-20 lb (5-9 kg). These ranges vary depending on your individual health. If you are carrying more than one baby (multiples), it may be safe to gain more weight than these recommendations. If you gain less weight than recommended, that may be safe as long as your baby is growing and developing normally. How can unhealthy weight gain affect me and my baby? Gaining too much weight during pregnancy can lead to pregnancy complications, such as:  A temporary form of diabetes that develops during pregnancy (gestational diabetes).  High blood pressure during pregnancy and protein in your urine (preeclampsia).  High blood pressure during pregnancy without protein in your urine (gestational hypertension).  Your baby having a high weight at birth, which may: ? Raise your risk of having a more difficult delivery or a surgical delivery (cesarean delivery, or C-section). ? Raise your child's risk of developing obesity during  childhood. Not gaining enough weight can be life-threatening for your baby, and it may raise your baby's chances of:  Being born early (preterm).  Growing more slowly than normal during pregnancy (growth restriction).  Having a low weight at birth. What actions can I take to gain a healthy amount of weight during pregnancy? General instructions  Keep track of your weight gain during pregnancy.  Take over-the-counter and prescription medicines only as told by your health care provider. Take all prenatal supplements as directed.  Keep all health care visits during pregnancy (prenatal visits). These visits are a good time to discuss your weight gain. Your health care provider will weigh you at each visit to make sure you are gaining a healthy amount of weight. Nutrition   Eat a balanced, nutrient-rich diet. Eat plenty of: ? Fruits and vegetables, such as berries and broccoli. ? Whole grains, such as millet, barley, whole-wheat breads and cereals, and oatmeal. ? Low-fat dairy products or non-dairy products such as almond milk or rice milk. ? Protein foods, such as lean meat, chicken, eggs, and legumes (such as peas, beans, soybeans, and lentils).  Avoid foods that are fried or have a lot of fat, salt (sodium), or sugar.  Drink enough fluid to keep your urine pale yellow.  Choose healthy snack and drink options when you are at work or on the go: ? Drink water. Avoid soda, sports drinks, and juices that have added sugar. ? Avoid drinks with caffeine, such as coffee and energy drinks. ? Eat snacks that are high in protein, such as nuts, protein bars, and low-fat yogurt. ? Carry convenient snacks in your purse that do not need refrigeration, such as a pack of trail mix, an apple, or a granola bar.  If you need help improving your diet, work with a health care provider or a diet and nutrition specialist (dietitian). Activity   Exercise regularly, as told by your health care  provider. ? If you were active before becoming pregnant, you may be able to continue your regular fitness activities. ? If you were not active before pregnancy, you may gradually build up to exercising for 30 or more minutes on most days of the week. This may include walking, swimming, or  yoga.  Ask your health care provider what activities are safe for you. Talk with your health care provider about whether you may need to be excused from certain school or work activities. Where to find more information Learn more about managing your weight gain during pregnancy from:  American Pregnancy Association: www.americanpregnancy.org  [place] Department of Agriculture pregnancy weight gain calculator: [employer] Summary  Too much weight gain during pregnancy can lead to complications for you and your baby.  Find out your pre-pregnancy BMI to determine how much weight gain is healthy for you.  Eat nutritious foods and stay active.  Keep all of your prenatal visits as told by your health care provider. This information is not intended to replace advice given to you by your health care provider. Make sure you discuss any questions you have with your health care provider. Document Released: 02/23/2017 Document Revised: 02/23/2017 Document Reviewed: 02/23/2017 Elsevier Interactive Patient Education  2019 [employer].  How a Baby Grows During Pregnancy  Pregnancy begins when a female's sperm enters a female's egg (fertilization). Fertilization usually happens in one of the tubes (fallopian tubes) that connect the ovaries to the womb (uterus). The fertilized egg moves down the fallopian tube to the uterus. Once it reaches the uterus, it implants into the lining of the uterus and begins to grow. For the first 10 weeks, the fertilized egg is called an embryo. After 10 weeks, it is called a fetus. As the fetus continues to grow, it receives oxygen and nutrients through tissue (placenta) that grows  to support the developing baby. The placenta is the life support system for the baby. It provides oxygen and nutrition and removes waste. Learning as much as you can about your pregnancy and how your baby is developing can help you enjoy the experience. It can also make you aware of when there might be a problem and when to ask questions. How long does a typical pregnancy last? A pregnancy usually lasts 280 days, or about 40 weeks. Pregnancy is divided into three periods of growth, also called trimesters:  First trimester: 0-12 weeks.  Second trimester: 13-27 weeks.  Third trimester: 28-40 weeks. The day when your baby is ready to be born (full term) is your estimated date of delivery. How does my baby develop month by month? First month  The fertilized egg attaches to the inside of the uterus.  Some cells will form the placenta. Others will form the fetus.  The arms, legs, brain, spinal cord, lungs, and heart begin to develop.  At the end of the first month, the heart begins to beat. Second month  The bones, inner ear, eyelids, hands, and feet form.  The genitals develop.  By the end of 8 weeks, all major organs are developing. Third month  All of the internal organs are forming.  Teeth develop below the gums.  Bones and muscles begin to grow. The spine can flex.  The skin is transparent.  Fingernails and toenails begin to form.  Arms and legs continue to grow longer, and hands and feet develop.  The fetus is about 3 inches (7.6 cm) long. Fourth month  The placenta is completely formed.  The external sex organs, neck, outer ear, eyebrows, eyelids, and fingernails are formed.  The fetus can hear, swallow, and move its arms and legs.  The kidneys begin to produce urine.  The skin is covered with a white, waxy coating (vernix) and very fine hair (lanugo). Fifth month  The fetus moves  around more and can be felt for the first time (quickening).  The fetus starts  to sleep and wake up and may begin to suck its finger.  The nails grow to the end of the fingers.  The organ in the digestive system that makes bile (gallbladder) functions and helps to digest nutrients.  If your baby is a girl, eggs are present in her ovaries. If your baby is a boy, testicles start to move down into his scrotum. Sixth month  The lungs are formed.  The eyes open. The brain continues to develop.  Your baby has fingerprints and toe prints. Your baby's hair grows thicker.  At the end of the second trimester, the fetus is about 9 inches (22.9 cm) long. Seventh month  The fetus kicks and stretches.  The eyes are developed enough to sense changes in light.  The hands can make a grasping motion.  The fetus responds to sound. Eighth month  All organs and body systems are fully developed and functioning.  Bones harden, and taste buds develop. The fetus may hiccup.  Certain areas of the brain are still developing. The skull remains soft. Ninth month  The fetus gains about  lb (0.23 kg) each week.  The lungs are fully developed.  Patterns of sleep develop.  The fetus's head typically moves into a head-down position (vertex) in the uterus to prepare for birth.  The fetus weighs 6-9 lb (2.72-4.08 kg) and is 19-20 inches (48.26-50.8 cm) long. What can I do to have a healthy pregnancy and help my baby develop? General instructions  Take prenatal vitamins as directed by your health care provider. These include vitamins such as folic acid, iron, calcium, and vitamin D. They are important for healthy development.  Take medicines only as directed by your health care provider. Read labels and ask a pharmacist or your health care provider whether over-the-counter medicines, supplements, and prescription drugs are safe to take during pregnancy.  Keep all follow-up visits as directed by your health care provider. This is important. Follow-up visits include prenatal care  and screening tests. How do I know if my baby is developing well? At each prenatal visit, your health care provider will do several different tests to check on your health and keep track of your baby's development. These include:  Fundal height and position. ? Your health care provider will measure your growing belly from your pubic bone to the top of the uterus using a tape measure. ? Your health care provider will also feel your belly to determine your baby's position.  Heartbeat. ? An ultrasound in the first trimester can confirm pregnancy and show a heartbeat, depending on how far along you are. ? Your health care provider will check your baby's heart rate at every prenatal visit.  Second trimester ultrasound. ? This ultrasound checks your baby's development. It also may show your baby's gender. What should I do if I have concerns about my baby's development? Always talk with your health care provider about any concerns that you may have about your pregnancy and your baby. Summary  A pregnancy usually lasts 280 days, or about 40 weeks. Pregnancy is divided into three periods of growth, also called trimesters.  Your health care provider will monitor your baby's growth and development throughout your pregnancy.  Follow your health care provider's recommendations about taking prenatal vitamins and medicines during your pregnancy.  Talk with your health care provider if you have any concerns about your pregnancy or your developing  baby. This information is not intended to replace advice given to you by your health care provider. Make sure you discuss any questions you have with your health care provider. Document Released: 11/22/2007 Document Revised: 04/18/2017 Document Reviewed: 04/18/2017 Elsevier Interactive Patient Education  2019 [place] During Pregnancy It is normal to feel stress sometimes. Pregnancy can be an especially stressful time because of the  changes in your body and the preparation involved in becoming a parent. Although it is normal to feel some stress from time to time, feeling a lot of stress over time can cause short-term and long-term health problems. Managing your stress is an important part of having a healthy pregnancy and a healthy baby. Healthy management of stress lowers your risk of problems and allows you to be better prepared to care for your baby. How can stress affect me? Stress during pregnancy can make you:  Have trouble sleeping.  Feel sad or depressed during your pregnancy or after your baby is born.  Eat less or more than you should.  Want to eat unhealthy foods.  Want to use drugs or alcohol. Stress can also cause physical symptoms, such as:  Sore, tense muscles, especially in the shoulders and neck.  Headaches.  Difficulty breathing.  A faster heart rate.  Stomach pain, nausea, or vomiting.  Diarrhea or constipation. How can stress affect my baby? The changes in your body caused by stress can affect your growing baby. They can cause your baby to:  Be born early (prematurely).  Have a low birth weight.  Have anxiety or learning problems during childhood. What can increase my risk? You may be at greater risk for stress-related problems during pregnancy if you:  Have an unplanned pregnancy.  Do not have support from friends or family.  Have other major life changes at the same time, such as a job change or a move.  Are in an abusive relationship, or you are regularly exposed to violence.  Live in poverty. What actions can I take to manage stress? Try these suggestions to reduce stress:  Figure out what makes you feel stressed and avoid it when possible.  Get at least 8 hours of sleep each night.  Follow a healthy diet. Eat plenty of whole grains, fruits, and vegetables. Limit fatty or sugary foods.  Exercise every day. Ask your health care provider what activities are safe for  you. Yoga classes, walking, swimming, and even light jogging can all be safe and effective stress-reducing activities.  Do not drink alcohol, smoke cigarettes, or use other forms of tobacco.  Do not use drugs to manage stress.  Do not use medicine for stress unless the medicine has been prescribed to you by your health care provider during your pregnancy.  Cut back on responsibilities at work and home, if possible. Ask for help from friends or family members if you need it.  Spend time doing relaxing activities that you enjoy, like listening to music, spending time with friends, or reading a good book.  Practice relaxation techniques like meditation and deep breathing. Contact a health care provider if you:  Have symptoms of stress that do not get better or get worse.  Need help quitting smoking, drugs, or alcohol. Get help right away if you:  Have signs of pregnancy complications. These include: ? Bleeding from your vagina. ? Swollen hands and feet. ? Changes in your vision. ? Headaches that are extremely painful or last a long time.  Think you  might be going into labor early. These signs include: ? Back pain. ? Abdominal cramping. ? A feeling of squeezing or tightening in your abdomen. These may be contractions. ? Fluid coming from your vagina. ? Pressure in your pelvis. Summary  Stress can harm your health during and after pregnancy, especially if you are stressed for a long time.  Stress during pregnancy can cause problems for your baby before and after birth.  Take steps to manage your stress, such as getting plenty of sleep, exercising, and practicing relaxation techniques.  Seek medical care if you have signs of physical stress, pregnancy complications, or premature labor. This information is not intended to replace advice given to you by your health care provider. Make sure you discuss any questions you have with your health care provider. Document Released:  04/30/2017 Document Revised: 04/30/2017 Document Reviewed: 04/30/2017 Elsevier Interactive Patient Education  2019 [place].  Hypertension During Pregnancy  Hypertension, commonly called high blood pressure, is when the force of blood pumping through your arteries is too strong. Arteries are blood vessels that carry blood from the heart throughout the body. Hypertension during pregnancy can cause problems for you and your baby. Your baby may be born early (prematurely) or may not weigh as much as he or she should at birth. Very bad cases of hypertension during pregnancy can be life-threatening. Different types of hypertension can occur during pregnancy. These include:  Chronic hypertension. This happens when: ? You have hypertension before pregnancy and it continues during pregnancy. ? You develop hypertension before you are [redacted] weeks pregnant, and it continues during pregnancy.  Gestational hypertension. This is hypertension that develops after the 20th week of pregnancy.  Preeclampsia, also called toxemia of pregnancy. This is a very serious type of hypertension that develops during pregnancy. It can be very dangerous for you and your baby. ? In rare cases, you may develop preeclampsia after giving birth (postpartum preeclampsia). This usually occurs within 48 hours after childbirth but may occur up to 6 weeks after giving birth. Gestational hypertension and preeclampsia usually go away within 6 weeks after your baby is born. Women who have hypertension during pregnancy have a greater chance of developing hypertension later in life or during future pregnancies. What are the causes? The exact cause of hypertension during pregnancy is not known. What increases the risk? There are certain factors that make it more likely for you to develop hypertension during pregnancy. These include:  Having hypertension during a previous pregnancy or prior to pregnancy.  Being overweight.  Being age 26  or older.  Being pregnant for the first time.  Being pregnant with more than one baby.  Becoming pregnant using fertilization methods such as IVF (in vitro fertilization).  Having diabetes, kidney problems, or systemic lupus erythematosus.  Having a family history of hypertension. What are the signs or symptoms? Chronic hypertension and gestational hypertension rarely cause symptoms. Preeclampsia causes symptoms, which may include:  Increased protein in your urine. Your health care provider will check for this at every visit before you give birth (prenatal visit).  Severe headaches.  Sudden weight gain.  Swelling of the hands, face, legs, and feet.  Nausea and vomiting.  Vision problems, such as blurred or double vision.  Numbness in the face, arms, legs, and feet.  Dizziness.  Slurred speech.  Sensitivity to bright lights.  Abdominal pain.  Convulsions or seizures. How is this diagnosed? You may be diagnosed with hypertension during a routine prenatal exam. At each prenatal  visit, you may:  Have a urine test to check for high amounts of protein in your urine.  Have your blood pressure checked. A blood pressure reading is given as two numbers, such as "120 over 80" (or 120/80). The first ("top") number is a measure of the pressure in your arteries when your heart beats (systolic pressure). The second ("bottom") number is a measure of the pressure in your arteries as your heart relaxes between beats (diastolic pressure). Blood pressure is measured in a unit called mm Hg. For most women, a normal blood pressure reading is: ? Systolic: below 242. ? Diastolic: below 80. The type of hypertension that you are diagnosed with depends on your test results and when your symptoms developed.  Chronic hypertension is usually diagnosed before 20 weeks of pregnancy.  Gestational hypertension is usually diagnosed after 20 weeks of pregnancy.  Hypertension with high amounts of  protein in the urine is diagnosed as preeclampsia.  Blood pressure measurements that stay above 353 systolic, or above 614 diastolic, are signs of severe preeclampsia. How is this treated? Treatment for hypertension during pregnancy varies depending on the type of hypertension you have and how serious it is.  If you take medicines called ACE inhibitors to treat chronic hypertension, you may need to switch medicines. ACE inhibitors should not be taken during pregnancy.  If you have gestational hypertension, you may need to take blood pressure medicine.  If you are at risk for preeclampsia, your health care provider may recommend that you take a low-dose aspirin during your pregnancy.  If you have severe preeclampsia, you may need to be hospitalized so you and your baby can be monitored closely. You may also need to take medicine (magnesium sulfate) to prevent seizures and to lower blood pressure. This medicine may be given as an injection or through an IV.  In some cases, if your condition gets worse, you may need to deliver your baby early. Follow these instructions at home: Eating and drinking   Drink enough fluid to keep your urine pale yellow.  Avoid caffeine. Lifestyle  Do not use any products that contain nicotine or tobacco, such as cigarettes and e-cigarettes. If you need help quitting, ask your health care provider.  Do not use alcohol or drugs.  Avoid stress as much as possible. Rest and get plenty of sleep. General instructions  Take over-the-counter and prescription medicines only as told by your health care provider.  While lying down, lie on your left side. This keeps pressure off your major blood vessels.  While sitting or lying down, raise (elevate) your feet. Try putting some pillows under your lower legs.  Exercise regularly. Ask your health care provider what kinds of exercise are best for you.  Keep all prenatal and follow-up visits as told by your health  care provider. This is important. Contact a health care provider if:  You have symptoms that your health care provider told you may require more treatment or monitoring, such as: ? Nausea or vomiting. ? Headache. Get help right away if you have:  Severe abdominal pain that does not get better with treatment.  A severe headache that does not get better.  Vomiting that does not get better.  Sudden, rapid weight gain.  Sudden swelling in your hands, ankles, or face.  Vaginal bleeding.  Blood in your urine.  Fewer movements from your baby than usual.  Blurred or double vision.  Muscle twitching or sudden muscle tightening (spasms).  Shortness of breath.  Blue fingernails or lips. Summary  Hypertension, commonly called high blood pressure, is when the force of blood pumping through your arteries is too strong.  Hypertension during pregnancy can cause problems for you and your baby.  Treatment for hypertension during pregnancy varies depending on the type of hypertension you have and how serious it is.  Get help right away if you have symptoms that your health care provider told you to watch for. This information is not intended to replace advice given to you by your health care provider. Make sure you discuss any questions you have with your health care provider. Document Released: 02/21/2011 Document Revised: 05/22/2017 Document Reviewed: 11/19/2015 Elsevier Interactive Patient Education  2019 [employer].

## 2018-11-06 NOTE — Progress Notes (Signed)
Established Patient Office Visit  Subjective:  Patient ID: Dorothy Fernandez, female    DOB: Apr 07, 1986  Age: 33 y.o. MRN: [id number]  CC:  Chief Complaint  Patient presents with  . Transitions Of Care    pt needs new PCP to manage medications     HPI Dorothy Fernandez presents to establish care with myself as PCP. Pt reports she is newly pregnant, estimated gestational age of [redacted] weeks. She has established care with OB in the past and is scheduled to meet with them next week. At this time, she also needs medication refills.  Propranolol: Taking for idiopathic tachycardia. Has been seen and since discharged from cardiology care. As this is a category C medication for pregnancy, I will defer to OB for management of tachycardia in pregnancy   Buspirone: Pt taking 10mg  bid for anxiety. Pregnancy category B medication, will reorder, but defer to [facility] decision regarding it's continuation through pregnancy.   Pt reports she is otherwise healthy, no acute or chronic complaints at this time. Never smoker, not currently drinking EtOH.   Had an ectopic pregnancy in November 2019. Seen by [facility] previously for assistance conceiving, excited to be pregnant.  Had Pap in January 2020 with OB, NILM.  Past Medical History:  Diagnosis Date  . Anxiety   . Tachycardia     Past Surgical History:  Procedure Laterality Date  . TONSILLECTOMY      Family History  Problem Relation Age of Onset  . Hyperlipidemia Mother   . Cancer Maternal Grandmother        Cervical  . Diabetes Maternal Grandfather     Social History   Socioeconomic History  . Marital status: Married    Spouse name: Not on file  . Number of children: 0  . Years of education: Not on file  . Highest education level: Doctorate  Occupational History  . Occupation: Software engineer  Social Needs  . Financial resource strain: Not hard at all  . Food insecurity:    Worry: Never true    Inability: Never true  . Transportation  needs:    Medical: No    Non-medical: No  Tobacco Use  . Smoking status: Never Smoker  . Smokeless tobacco: Never Used  Substance and Sexual Activity  . Alcohol use: Not Currently    Comment: 1 X a year  . Drug use: No  . Sexual activity: Yes    Partners: Male    Birth control/protection: None  Lifestyle  . Physical activity:    Days per week: 2 days    Minutes per session: 30 min  . Stress: Very much  Relationships  . Social connections:    Talks on phone: Twice a week    Gets together: Once a week    Attends religious service: 1 to 4 times per year    Active member of club or organization: No    Attends meetings of clubs or organizations: Never    Relationship status: Married  . Intimate partner violence:    Fear of current or ex partner: No    Emotionally abused: No    Physically abused: No    Forced sexual activity: No  Other Topics Concern  . Not on file  Social History Narrative  . Not on file    Outpatient Medications Prior to Visit  Medication Sig Dispense Refill  . doxylamine, Sleep, (UNISOM) 25 MG tablet Take 25 mg by mouth at bedtime as needed for sleep. Taking  1/2 tablet at bed time    . Multiple Vitamin (MULTIVITAMIN) capsule Take by mouth.    . nitrofurantoin, macrocrystal-monohydrate, (MACROBID) 100 MG capsule Take 100 mg by mouth 2 (two) times daily as needed.    . propranolol ER (INDERAL LA) 80 MG 24 hr capsule   10  . triamcinolone cream (KENALOG) 0.1 % daily.    . busPIRone (BUSPAR) 10 MG tablet Take 1 tablet (10 mg total) by mouth 2 (two) times daily. 180 tablet 0  . hydrOXYzine (ATARAX/VISTARIL) 25 MG tablet Take 1 tablet (25 mg total) by mouth at bedtime. 90 tablet 0  . promethazine (PHENERGAN) 25 MG tablet Take 0.5-1 tablets (12.5-25 mg total) by mouth every 6 (six) hours as needed for nausea. 30 tablet 0  . propranolol (INDERAL) 20 MG tablet Take by mouth.     No facility-administered medications prior to visit.     Allergies  Allergen  Reactions  . Cephalosporins Rash  . Erythromycin Rash  . Penicillins Rash  . Sulfa Antibiotics Rash  . Ceclor [Cefaclor] Hives  . Septra [Sulfamethoxazole-Trimethoprim]   . Suprax [Cefixime] Hives    ROS Review of Systems  Constitutional: Negative.   HENT: Negative.   Eyes: Negative.   Respiratory: Negative.   Cardiovascular: Negative.   Gastrointestinal: Negative.   Endocrine: Negative.   Genitourinary: Negative.   Musculoskeletal: Negative.   Skin: Negative.   Allergic/Immunologic: Negative.   Neurological: Negative.   Hematological: Negative.   Psychiatric/Behavioral: Negative.       Objective:    Physical Exam  Constitutional: She is oriented to person, place, and time. She appears well-developed and well-nourished.  HENT:  Head: Normocephalic and atraumatic.  Neck: Normal range of motion.  Cardiovascular: Normal rate, regular rhythm and normal heart sounds. Exam reveals no gallop and no friction rub.  No murmur heard. Pulmonary/Chest: Effort normal and breath sounds normal. No respiratory distress. She has no wheezes. She has no rales. She exhibits no tenderness.  Genitourinary:    Genitourinary Comments: deferred   Musculoskeletal: Normal range of motion.  Neurological: She is oriented to person, place, and time.  Skin: Skin is warm and dry.  Psychiatric: She has a normal mood and affect. Her behavior is normal. Judgment and thought content normal.    BP 102/68   Pulse 95   Temp 98 F (36.7 C) (Oral)   Resp 16   Ht 5\' 3"  (1.6 m)   Wt 113 lb (51.3 kg)   LMP 03/28/2018 (LMP Unknown) Comment: Pt is currently [redacted] weeks pregnant  SpO2 100%   BMI 20.02 kg/m  Wt Readings from Last 3 Encounters:  11/06/18 113 lb (51.3 kg)  05/01/18 113 lb (51.3 kg)  04/04/18 113 lb 9.6 oz (51.5 kg)     Health Maintenance Due  Topic Date Due  . PAP SMEAR-Modifier  11/13/2006    There are no preventive care reminders to display for this patient.  Lab Results   Component Value Date   TSH 1.300 11/22/2017   Lab Results  Component Value Date   WBC 13.4 (H) 05/01/2018   HGB 13.6 05/01/2018   HCT 40.3 05/01/2018   MCV 91.4 05/01/2018   PLT 272 05/01/2018   Lab Results  Component Value Date   NA 138 05/01/2018   K 4.1 05/01/2018   CO2 23 05/01/2018   GLUCOSE 101 (H) 05/01/2018   BUN 13 05/01/2018   CREATININE 0.73 05/01/2018   BILITOT 0.8 05/01/2018   ALKPHOS 50 05/01/2018  AST 15 05/01/2018   ALT 13 05/01/2018   PROT 7.7 05/01/2018   ALBUMIN 4.6 05/01/2018   CALCIUM 9.6 05/01/2018   ANIONGAP 10 05/01/2018   Lab Results  Component Value Date   CHOL 140 11/22/2017   Lab Results  Component Value Date   HDL 64 11/22/2017   Lab Results  Component Value Date   LDLCALC 68 11/22/2017   Lab Results  Component Value Date   TRIG 42 11/22/2017   Lab Results  Component Value Date   CHOLHDL 2.2 11/22/2017   No results found for: HGBA1C    Assessment & Plan:   Problem List Items Addressed This Visit      Other   Pregnant    Other Visit Diagnoses    Generalized anxiety disorder       Relevant Medications   busPIRone (BUSPAR) 10 MG tablet      Meds ordered this encounter  Medications  . busPIRone (BUSPAR) 10 MG tablet    Sig: Take 1 tablet (10 mg total) by mouth 2 (two) times daily.    Dispense:  180 tablet    Refill:  0    Order Specific Question:   Supervising Provider    Answer:   Forrest Moron [id number]    Follow-up: No follow-ups on file.   PLAN:  Discussed the importance of following with OB throughout pregnancy, taking prenatal vitamins. Pt is established with OB.  Reordered Buspirone, will delay on Propranolol given pregnancy category C   Patient encouraged to call clinic with any questions, comments, or concerns.  I spent 18 minutes with the patient, more than 50% of which was spent counseling/educating.  [name], NP

## 2018-11-21 LAB — OB RESULTS CONSOLE HEPATITIS B SURFACE ANTIGEN: Hepatitis B Surface Ag: NEGATIVE

## 2018-11-21 LAB — OB RESULTS CONSOLE ANTIBODY SCREEN: Antibody Screen: NEGATIVE

## 2018-11-21 LAB — OB RESULTS CONSOLE ABO/RH: RH Type: POSITIVE

## 2018-11-21 LAB — OB RESULTS CONSOLE HIV ANTIBODY (ROUTINE TESTING): HIV: NONREACTIVE

## 2018-11-21 LAB — OB RESULTS CONSOLE RUBELLA ANTIBODY, IGM: Rubella: IMMUNE

## 2018-11-21 LAB — OB RESULTS CONSOLE RPR: RPR: NONREACTIVE

## 2018-11-21 LAB — OB RESULTS CONSOLE GC/CHLAMYDIA
Chlamydia: NEGATIVE
Gonorrhea: NEGATIVE

## 2018-12-06 MED FILL — PROPRANOLOL HCL ER 80 MG CP: 80 | 30 days supply | Qty: 30 | Fill #10

## 2018-12-21 ENCOUNTER — Inpatient Hospital Stay (HOSPITAL_COMMUNITY): Payer: Managed Care, Other (non HMO)

## 2018-12-21 ENCOUNTER — Inpatient Hospital Stay (HOSPITAL_COMMUNITY)
Admission: AD | Admit: 2018-12-21 | Discharge: 2018-12-21 | Disposition: A | Discharge status: Home / Self Care | Payer: Managed Care, Other (non HMO) | Source: Ambulatory Visit | Attending: Obstetrics and Gynecology | Admitting: Obstetrics and Gynecology

## 2018-12-21 ENCOUNTER — Encounter (HOSPITAL_COMMUNITY): Payer: Self-pay

## 2018-12-21 ENCOUNTER — Other Ambulatory Visit: Payer: Self-pay

## 2018-12-21 DIAGNOSIS — O26899 Other specified pregnancy related conditions, unspecified trimester: Secondary | ICD-10-CM

## 2018-12-21 DIAGNOSIS — O26891 Other specified pregnancy related conditions, first trimester: Secondary | ICD-10-CM | POA: Diagnosis not present

## 2018-12-21 DIAGNOSIS — D252 Subserosal leiomyoma of uterus: Secondary | ICD-10-CM | POA: Insufficient documentation

## 2018-12-21 DIAGNOSIS — D259 Leiomyoma of uterus, unspecified: Secondary | ICD-10-CM

## 2018-12-21 DIAGNOSIS — Z880 Allergy status to penicillin: Secondary | ICD-10-CM | POA: Insufficient documentation

## 2018-12-21 DIAGNOSIS — R109 Unspecified abdominal pain: Secondary | ICD-10-CM | POA: Diagnosis not present

## 2018-12-21 DIAGNOSIS — O3411 Maternal care for benign tumor of corpus uteri, first trimester: Secondary | ICD-10-CM | POA: Insufficient documentation

## 2018-12-21 DIAGNOSIS — R1084 Generalized abdominal pain: Secondary | ICD-10-CM | POA: Insufficient documentation

## 2018-12-21 DIAGNOSIS — Z3A13 13 weeks gestation of pregnancy: Secondary | ICD-10-CM | POA: Insufficient documentation

## 2018-12-21 HISTORY — DX: Calculus of kidney: N20.0

## 2018-12-21 LAB — COMPREHENSIVE METABOLIC PANEL
ALT: 11 U/L (ref 0–44)
AST: 14 U/L — ABNORMAL LOW (ref 15–41)
Albumin: 3.1 g/dL — ABNORMAL LOW (ref 3.5–5.0)
Alkaline Phosphatase: 70 U/L (ref 38–126)
Anion gap: 8 (ref 5–15)
BUN: 6 mg/dL (ref 6–20)
CO2: 21 mmol/L — ABNORMAL LOW (ref 22–32)
Calcium: 9.6 mg/dL (ref 8.9–10.3)
Chloride: 106 mmol/L (ref 98–111)
Creatinine, Ser: 0.57 mg/dL (ref 0.44–1.00)
GFR calc Af Amer: >60 mL/min (ref >60)
GFR calc non Af Amer: >60 mL/min (ref >60)
Glucose, Bld: 112 mg/dL — ABNORMAL HIGH (ref 70–99)
Potassium: 4.1 mmol/L (ref 3.5–5.1)
Sodium: 135 mmol/L (ref 135–145)
Total Bilirubin: 0.2 mg/dL — ABNORMAL LOW (ref 0.3–1.2)
Total Protein: 6.2 g/dL — ABNORMAL LOW (ref 6.5–8.1)

## 2018-12-21 LAB — CBC
HCT: 37.3 % (ref 36.0–46.0)
Hemoglobin: 12.5 g/dL (ref 12.0–15.0)
MCH: 29.9 pg (ref 26.0–34.0)
MCHC: 33.5 g/dL (ref 30.0–36.0)
MCV: 89.2 fL (ref 80.0–100.0)
Platelets: 283 10*3/uL (ref 150–400)
RBC: 4.18 MIL/uL (ref 3.87–5.11)
RDW: 12.4 % (ref 11.5–15.5)
WBC: 17.3 10*3/uL — ABNORMAL HIGH (ref 4.0–10.5)
nRBC: 0 % (ref 0.0–0.2)

## 2018-12-21 MED ORDER — OXYCODONE-ACETAMINOPHEN 5-325 MG PO TABS: 1.0000 | ORAL_TABLET | Freq: Four times a day (QID) | ORAL | 0 refills | Status: DC | PRN

## 2018-12-21 MED ORDER — LACTATED RINGERS IV BOLUS
1000.0000 mL | Freq: Once | INTRAVENOUS | Status: AC
  Administered 2018-12-21: 1000 mL via INTRAVENOUS

## 2018-12-21 MED ORDER — ONDANSETRON HCL 4 MG/2ML IJ SOLN
4.0000 mg | Freq: Once | INTRAMUSCULAR | Status: AC
  Administered 2018-12-21: 4 mg via INTRAVENOUS
  Filled 2018-12-21: qty 2

## 2018-12-21 MED ORDER — LACTATED RINGERS IV SOLN
INTRAVENOUS | Status: DC
  Administered 2018-12-21: 05:00:00 via INTRAVENOUS

## 2018-12-21 MED ORDER — HYDROMORPHONE HCL 1 MG/ML IJ SOLN
1.0000 mg | Freq: Once | INTRAMUSCULAR | Status: AC
  Administered 2018-12-21: 1 mg via INTRAVENOUS
  Filled 2018-12-21: qty 1

## 2018-12-21 MED ORDER — ONDANSETRON HCL 4 MG PO TABS: 4.0000 mg | ORAL_TABLET | Freq: Three times a day (TID) | ORAL | 0 refills | Status: DC | PRN

## 2018-12-21 NOTE — MAU Note (Addendum)
RN gave and reviewed discharge instructions with the patient. Patient verbalized understanding. Had no further questions at this time. Patient aware CNM has electronically sent prescriptions to desired pharmacy. Patient ambulatory upon discharge. Vital signs stable. Belongings sent home with patient.

## 2018-12-21 NOTE — MAU Note (Signed)
Severe abdominal pain and rectal pressure that started a few hours ago-was off and on until 1 hour ago it became constant and unbearable.  Took a liquid antacid with no relief. No VB.

## 2018-12-21 NOTE — MAU Provider Note (Signed)
Chief Complaint: Abdominal Pain   First Provider Initiated Contact with Patient 12/21/18 0342      SUBJECTIVE HPI: Dorothy Fernandez is a 33 y.o. G2P0010 at [redacted]w[redacted]d by LMP, early ultrasound who presents to maternity admissions reporting onset of severe abdominal pain 2-3 hours ago. The pain is constant, waxes and wanes in intensity and is generalized over her entire abdomen.  The pain has worsened since onset. It is associated with nausea with emesis x 1 when she arrived in MAU.  She tried a liquid antacid at home that did not help.  She had a bowel movement after  the pain first started but that did not change the pain.  It is only in her abdomen, it does not radiate. Her pregnancy has been uncomplicated with normal [place] in the office.   She denies vaginal bleeding, vaginal itching/burning, urinary symptoms, h/a, dizziness, or fever/chills.     HPI  Past Medical History:  Diagnosis Date  . Anxiety   . Nephrolithiasis   . Tachycardia    Past Surgical History:  Procedure Laterality Date  . TONSILLECTOMY     Social History   Socioeconomic History  . Marital status: Married    Spouse name: Not on file  . Number of children: 0  . Years of education: Not on file  . Highest education level: Doctorate  Occupational History  . Occupation: Software engineer  Social Needs  . Financial resource strain: Not hard at all  . Food insecurity    Worry: Never true    Inability: Never true  . Transportation needs    Medical: No    Non-medical: No  Tobacco Use  . Smoking status: Never Smoker  . Smokeless tobacco: Never Used  Substance and Sexual Activity  . Alcohol use: Not Currently    Comment: 1 X a year  . Drug use: No  . Sexual activity: Yes    Partners: Male    Birth control/protection: None  Lifestyle  . Physical activity    Days per week: 2 days    Minutes per session: 30 min  . Stress: Very much  Relationships  . Social Herbalist on phone: Twice a week    Gets together: Once  a week    Attends religious service: 1 to 4 times per year    Active member of club or organization: No    Attends meetings of clubs or organizations: Never    Relationship status: Married  . Intimate partner violence    Fear of current or ex partner: No    Emotionally abused: No    Physically abused: No    Forced sexual activity: No  Other Topics Concern  . Not on file  Social History Narrative  . Not on file   No current facility-administered medications on file prior to encounter.    Current Outpatient Medications on File Prior to Encounter  Medication Sig Dispense Refill  . busPIRone (BUSPAR) 10 MG tablet Take 1 tablet (10 mg total) by mouth 2 (two) times daily. 180 tablet 0  . propranolol ER (INDERAL LA) 80 MG 24 hr capsule   10  . doxylamine, Sleep, (UNISOM) 25 MG tablet Take 25 mg by mouth at bedtime as needed for sleep. Taking 1/2 tablet at bed time    . Multiple Vitamin (MULTIVITAMIN) capsule Take by mouth.    . nitrofurantoin, macrocrystal-monohydrate, (MACROBID) 100 MG capsule Take 100 mg by mouth 2 (two) times daily as needed.    Marland Kitchen  triamcinolone cream (KENALOG) 0.1 % daily.     Allergies  Allergen Reactions  . Cephalosporins Rash  . Erythromycin Rash  . Penicillins Rash  . Sulfa Antibiotics Rash  . Ceclor [Cefaclor] Hives  . Septra [Sulfamethoxazole-Trimethoprim]   . Suprax [Cefixime] Hives    ROS:  Review of Systems  Constitutional: Negative for chills, fatigue and fever.  HENT: Negative for sinus pressure.   Eyes: Negative for photophobia.  Respiratory: Negative for shortness of breath.   Cardiovascular: Negative for chest pain.  Gastrointestinal: Positive for abdominal distention, abdominal pain, nausea and vomiting. Negative for constipation and diarrhea.  Genitourinary: Negative for difficulty urinating, dysuria, flank pain, frequency, pelvic pain, vaginal bleeding, vaginal discharge and vaginal pain.  Musculoskeletal: Negative for neck pain.   Neurological: Negative for dizziness, weakness and headaches.  Psychiatric/Behavioral: Negative.      I have reviewed patient's Past Medical Hx, Surgical Hx, Family Hx, Social Hx, medications and allergies.   Physical Exam   Patient Vitals for the past 24 hrs:  BP Temp Pulse Resp SpO2 Height Weight  12/21/18 0512 - - - 18 - - -  12/21/18 0333 115/73 97.8 F (36.6 C) 89 (!) 22 100 % 5\' 3"  (1.6 m) 54.5 kg   Constitutional: Well-developed, well-nourished female in no acute distress.  Cardiovascular: normal rate Respiratory: normal effort GI: Abd soft, non-tender. Pos BS x 4 MS: Extremities nontender, no edema, normal ROM Neurologic: Alert and oriented x 4.  GU: Neg CVAT.  Dilation: Closed Cervical Position: Posterior Exam by:: Fatima Blank, CNM  Cervix subjectively shortened, but very posterior and pt pain made exam difficult.  Scant pink bleeding on glove following exam  FHT 148 by doppler  LAB RESULTS Results for orders placed or performed during the hospital encounter of 12/21/18 (from the past 24 hour(s))  CBC     Status: Abnormal   Collection Time: 12/21/18  3:37 AM  Result Value Ref Range   WBC 17.3 (H) 4.0 - 10.5 K/uL   RBC 4.18 3.87 - 5.11 MIL/uL   Hemoglobin 12.5 12.0 - 15.0 g/dL   HCT 37.3 36.0 - 46.0 %   MCV 89.2 80.0 - 100.0 fL   MCH 29.9 26.0 - 34.0 pg   MCHC 33.5 30.0 - 36.0 g/dL   RDW 12.4 11.5 - 15.5 %   Platelets 283 150 - 400 K/uL   nRBC 0.0 0.0 - 0.2 %  Comprehensive metabolic panel     Status: Abnormal   Collection Time: 12/21/18  3:37 AM  Result Value Ref Range   Sodium 135 135 - 145 mmol/L   Potassium 4.1 3.5 - 5.1 mmol/L   Chloride 106 98 - 111 mmol/L   CO2 21 (L) 22 - 32 mmol/L   Glucose, Bld 112 (H) 70 - 99 mg/dL   BUN 6 6 - 20 mg/dL   Creatinine, Ser 0.57 0.44 - 1.00 mg/dL   Calcium 9.6 8.9 - 10.3 mg/dL   Total Protein 6.2 (L) 6.5 - 8.1 g/dL   Albumin 3.1 (L) 3.5 - 5.0 g/dL   AST 14 (L) 15 - 41 U/L   ALT 11 0 - 44 U/L    Alkaline Phosphatase 70 38 - 126 U/L   Total Bilirubin 0.2 (L) 0.3 - 1.2 mg/dL   GFR calc non Af Amer >60 >60 mL/min   GFR calc Af Amer >60 >60 mL/min   Anion gap 8 5 - 15    --/--/O POS Performed at [facility], [place],  [place], [place] [address]  [phone])  IMAGING US Ob Comp Less 14 Wks  Result Date: 12/21/2018 CLINICAL DATA:  33 y/o  F; abdominal Pain and pregnancy. EXAM: OBSTETRIC <14 WK ULTRASOUND TECHNIQUE: Transabdominal ultrasound was performed for evaluation of the gestation as well as the maternal uterus and adnexal regions. COMPARISON:  None. FINDINGS: Intrauterine gestational sac: Single Yolk sac:  Not Visualized. Embryo:  Visualized. Cardiac Activity: Visualized. Heart Rate: 142 bpm CRL: 69.7 mm   13 w 1 d                  [place] EDC: 06/27/2019 Subchorionic hemorrhage:  None visualized. Maternal uterus/adnexae: Right ovary not visualized. Normal left ovary. Lower uterine segment posterior subserosal fibroid measuring up to 7.4 cm IMPRESSION: 1. No acute process identified. 2. Single live intrauterine pregnancy with estimated gestational age of [redacted] weeks and 1 day. 3. Large lower uterine segment posterior subserosal fibroid. Electronically Signed   By: Kristine Garbe M.D.   On: 12/21/2018 05:36    MAU Management/MDM: Orders Placed This Encounter  Procedures  . US OB Comp Less 14 Wks  . MR PELVIS WO CONTRAST  . MR ABDOMEN WO CONTRAST  . CBC  . Comprehensive metabolic panel  . Urinalysis, Routine w reflex microscopic  . Discharge patient    Meds ordered this encounter  Medications  . HYDROmorphone (DILAUDID) injection 1 mg  . lactated ringers bolus 1,000 mL  . ondansetron (ZOFRAN) injection 4 mg  . lactated ringers infusion  . DISCONTD: oxyCODONE-acetaminophen (PERCOCET/ROXICET) 5-325 MG tablet    Sig: Take 1 tablet by mouth every 6 (six) hours as needed.    Dispense:  15 tablet    Refill:  0    Order Specific Question:   Supervising Provider     Answer:   Donnamae Jude [[id number]]  . ondansetron (ZOFRAN) 4 MG tablet    Sig: Take 1 tablet (4 mg total) by mouth every 8 (eight) hours as needed for nausea or vomiting.    Dispense:  20 tablet    Refill:  0    Order Specific Question:   Supervising Provider    Answer:   Donnamae Jude [[id number]]  . oxyCODONE-acetaminophen (PERCOCET/ROXICET) 5-325 MG tablet    Sig: Take 1 tablet by mouth every 6 (six) hours as needed.    Dispense:  15 tablet    Refill:  0    Order Specific Question:   Supervising Provider    Answer:   Donnamae Jude [[id number]]    Pain generalized but more pain to palpation in RLQ, no rebound tenderness. Dilaudid 1 mg IV given, Zofran 4 mg IV, LR x 1000 ml.  Pt pain persists but improved with medication. WBCs elevated on CBC.  MRI of abdomen/pelvis to evaluate for appendicitis.    Preliminary result indicates normal appendix.  Two large uterine fibroids, one of which is degenerating.  This is likely cause of severe pain, elevated WBCs.  Consult Dr Kennon Rounds with assessment and findings.  D/C home with pain management and nausea medications.  Percocet 5/325, take 1-2 Q 6 hours PRN x 15 tabs, Zofran 4 mg PO Q 8 hours PRN.  F/U in office this week. Return to MAU with worsening symptoms or emergencies.  Pt discharged with strict return precautions.  ASSESSMENT 1. Abdominal pain affecting pregnancy   2. Uterine fibroids affecting pregnancy, antepartum, first trimester   3. Abdominal pain during pregnancy, first trimester     PLAN Discharge home Allergies  as of 12/21/2018      Reactions   Cephalosporins Rash   Erythromycin Rash   Penicillins Rash   Sulfa Antibiotics Rash   Ceclor [cefaclor] Hives   Septra [sulfamethoxazole-trimethoprim]    Suprax [cefixime] Hives      Medication List    TAKE these medications   busPIRone 10 MG tablet Commonly known as: BUSPAR Take 1 tablet (10 mg total) by mouth 2 (two) times daily.   doxylamine (Sleep) 25 MG tablet Commonly known as:  UNISOM Take 25 mg by mouth at bedtime as needed for sleep. Taking 1/2 tablet at bed time   multivitamin capsule Take by mouth.   nitrofurantoin (macrocrystal-monohydrate) 100 MG capsule Commonly known as: MACROBID Take 100 mg by mouth 2 (two) times daily as needed.   ondansetron 4 MG tablet Commonly known as: Zofran Take 1 tablet (4 mg total) by mouth every 8 (eight) hours as needed for nausea or vomiting.   oxyCODONE-acetaminophen 5-325 MG tablet Commonly known as: PERCOCET/ROXICET Take 1 tablet by mouth every 6 (six) hours as needed.   propranolol ER 80 MG 24 hr capsule Commonly known as: INDERAL LA   triamcinolone cream 0.1 % Commonly known as: KENALOG daily.      Follow-up Information    Meisinger, Sherren Mocha, MD Follow up.   Specialty: Obstetrics and Gynecology Why: Call the office this week for follow up.  Return to MAU with worsening symptoms. Contact information: [address] [place] [address] [phone]           Lerry Cordrey Leftwich-Kirby Certified Nurse-Midwife 12/21/2018  7:49 AM

## 2018-12-23 ENCOUNTER — Other Ambulatory Visit: Payer: Self-pay

## 2018-12-23 ENCOUNTER — Encounter (HOSPITAL_COMMUNITY): Payer: Self-pay

## 2018-12-23 ENCOUNTER — Inpatient Hospital Stay (HOSPITAL_COMMUNITY)
Admission: AD | Admit: 2018-12-23 | Discharge: 2018-12-23 | Disposition: A | Discharge status: Home / Self Care | Payer: Managed Care, Other (non HMO) | Attending: Obstetrics and Gynecology | Admitting: Obstetrics and Gynecology

## 2018-12-23 DIAGNOSIS — Z3A14 14 weeks gestation of pregnancy: Secondary | ICD-10-CM

## 2018-12-23 DIAGNOSIS — O210 Mild hyperemesis gravidarum: Secondary | ICD-10-CM

## 2018-12-23 LAB — URINALYSIS, ROUTINE W REFLEX MICROSCOPIC
Bilirubin Urine: NEGATIVE
Glucose, UA: NEGATIVE mg/dL
Hgb urine dipstick: NEGATIVE
Ketones, ur: 5 mg/dL — AB
Leukocytes,Ua: NEGATIVE
Nitrite: NEGATIVE
Protein, ur: 30 mg/dL — AB
Specific Gravity, Urine: 1.012 (ref 1.005–1.030)
pH: 9 — ABNORMAL HIGH (ref 5.0–8.0)

## 2018-12-23 MED ORDER — PROMETHAZINE HCL 25 MG PO TABS: 25.0000 mg | ORAL_TABLET | Freq: Four times a day (QID) | ORAL | 0 refills | Status: DC | PRN

## 2018-12-23 MED ORDER — M.V.I. ADULT IV INJ
Freq: Once | INTRAVENOUS | Status: AC
  Administered 2018-12-23: 16:00:00 via INTRAVENOUS
  Filled 2018-12-23: qty 1000

## 2018-12-23 MED ORDER — PROMETHAZINE HCL 25 MG/ML IJ SOLN
25.0000 mg | Freq: Once | INTRAVENOUS | Status: AC
  Administered 2018-12-23: 25 mg via INTRAVENOUS
  Filled 2018-12-23: qty 1

## 2018-12-23 NOTE — MAU Note (Signed)
Was here on Friday with abd pain.  Still having some pain, though not as bad.  Is here today because of n/v, started yesterday, can't keep anything down, not even water. No diarrhea- is extremely constipated, has taken laxatives and they aren't working either. Denies fever.

## 2018-12-23 NOTE — Discharge Instructions (Signed)
Zofran will cause constipation, so use it cautiously if you are having constipation.  Here is a list of safe medications in pregnancy. You will find them divided into medical problems/conditions.  Safe Medications in Pregnancy   Acne: Benzoyl Peroxide Salicylic Acid  Backache/Headache: Tylenol: 2 regular strength every 4 hours OR              2 Extra strength every 6 hours  Colds/Coughs/Allergies: Benadryl (alcohol free) 25 mg every 6 hours as needed Breath right strips Claritin Cepacol throat lozenges Chloraseptic throat spray Cold-Eeze- up to three times per day Cough drops, alcohol free Flonase (by prescription only) Guaifenesin Mucinex Robitussin DM (plain only, alcohol free) Saline nasal spray/drops Sudafed (pseudoephedrine) & Actifed ** use only after [redacted] weeks gestation and if you do not have high blood pressure Tylenol Vicks Vaporub Zinc lozenges Zyrtec   Constipation: Colace Ducolax suppositories Fleet enema Glycerin suppositories Metamucil Milk of magnesia Miralax Senokot Smooth move tea  Diarrhea: Kaopectate Imodium A-D  *NO pepto Bismol  Hemorrhoids: Anusol Anusol HC Preparation H Tucks  Indigestion: Tums Maalox Mylanta Zantac  Pepcid  Insomnia: Benadryl (alcohol free) 25mg  every 6 hours as needed Tylenol PM Unisom, no Gelcaps  Leg Cramps: Tums MagGel  Nausea/Vomiting:  Bonine Dramamine Emetrol Ginger extract Sea bands Meclizine  Nausea medication to take during pregnancy:  Unisom (doxylamine succinate 25 mg tablets) Take one tablet daily at bedtime. If symptoms are not adequately controlled, the dose can be increased to a maximum recommended dose of two tablets daily (1/2 tablet in the morning, 1/2 tablet mid-afternoon and one at bedtime). Vitamin B6 100mg  tablets. Take one tablet twice a day (up to 200 mg per day).  Skin Rashes: Aveeno products Benadryl cream or 25mg  every 6 hours as needed Calamine Lotion 1% cortisone  cream  Yeast infection: Gyne-lotrimin 7 Monistat 7   **If taking multiple medications, please check labels to avoid duplicating the same active ingredients **take medication as directed on the label ** Do not exceed 4000 mg of tylenol in 24 hours **Do not take medications that contain aspirin or ibuprofen

## 2018-12-23 NOTE — MAU Provider Note (Signed)
History     CSN: [id number]  Arrival date and time: 12/23/18 1251   First Provider Initiated Contact with Patient 12/23/18 1418      Chief Complaint  Patient presents with  . Abdominal Pain  . Nausea  . Emesis   HPI  Ms.  Dorothy Fernandez is a 33 y.o. year old [id number] female at [redacted]w[redacted]d weeks gestation who presents to MAU reporting N/V since 12/22/18 and unable to keep anything down. She was seen in MAU on Friday 12/20/18 for abdominal pain. She reports that she still has some abdominal pain today, but "not as bad." Se reports N/V started on 12/22/18. She reports she can't keep anything down; food or drinks. She last tried to eat a pudding cup and drink H2O at 1200 today. She was not able to keep those down. She denies fever or diarrhea, but reports "extremem constipation." She has taken Zofran 4 mg last night (12/22/2018), but has not taken any medication for the constipation.  Past Medical History:  Diagnosis Date  . Anxiety   . Nephrolithiasis   . Tachycardia     Past Surgical History:  Procedure Laterality Date  . TONSILLECTOMY      Family History  Problem Relation Age of Onset  . Hyperlipidemia Mother   . Cancer Maternal Grandmother        Cervical  . Diabetes Maternal Grandfather     Social History   Tobacco Use  . Smoking status: Never Smoker  . Smokeless tobacco: Never Used  Substance Use Topics  . Alcohol use: Not Currently    Comment: 1 X a year  . Drug use: No    Allergies:  Allergies  Allergen Reactions  . Cephalosporins Rash  . Erythromycin Rash  . Penicillins Rash  . Sulfa Antibiotics Rash  . Ceclor [Cefaclor] Hives  . Septra [Sulfamethoxazole-Trimethoprim]   . Suprax [Cefixime] Hives    Medications Prior to Admission  Medication Sig Dispense Refill Last Dose  . busPIRone (BUSPAR) 10 MG tablet Take 1 tablet (10 mg total) by mouth 2 (two) times daily. 180 tablet 0 12/22/2018 at Unknown time  . doxylamine, Sleep, (UNISOM) 25 MG tablet Take 25 mg by mouth  at bedtime as needed for sleep. Taking 1/2 tablet at bed time   Past Week at Unknown time  . Multiple Vitamin (MULTIVITAMIN) capsule Take by mouth.   Past Week at Unknown time  . ondansetron (ZOFRAN) 4 MG tablet Take 1 tablet (4 mg total) by mouth every 8 (eight) hours as needed for nausea or vomiting. 20 tablet 0 12/23/2018 at Unknown time  . oxyCODONE-acetaminophen (PERCOCET/ROXICET) 5-325 MG tablet Take 1 tablet by mouth every 6 (six) hours as needed. 15 tablet 0 12/22/2018 at Unknown time  . propranolol ER (INDERAL LA) 80 MG 24 hr capsule   10 12/22/2018 at Unknown time  . triamcinolone cream (KENALOG) 0.1 % daily.   Past Week at Unknown time  . nitrofurantoin, macrocrystal-monohydrate, (MACROBID) 100 MG capsule Take 100 mg by mouth 2 (two) times daily as needed.   More than a month at Unknown time    Review of Systems  Constitutional: Positive for appetite change.  HENT: Negative.   Eyes: Negative.   Respiratory: Negative.   Cardiovascular: Negative.   Gastrointestinal: Positive for abdominal pain (mild), constipation ("severe"), nausea and vomiting.  Endocrine: Negative.   Genitourinary: Negative.   Musculoskeletal: Negative.   Skin: Negative.   Allergic/Immunologic: Negative.   Neurological: Negative.   Hematological: Negative.  Psychiatric/Behavioral: Negative.    Physical Exam   Blood pressure 119/70, pulse 94, temperature 99 F (37.2 C), temperature source Oral, resp. rate 16, weight 54.7 kg, last menstrual period 03/28/2018, SpO2 100 %.  Physical Exam  Nursing note and vitals reviewed. Constitutional: She is oriented to person, place, and time. She appears well-developed and well-nourished.  HENT:  Head: Normocephalic and atraumatic.  Eyes: Pupils are equal, round, and reactive to light.  Neck: Normal range of motion.  Cardiovascular: Normal rate.  Respiratory: Effort normal.  GI: Soft. Bowel sounds are decreased. There is no abdominal tenderness.  Genitourinary:     Genitourinary Comments: deferred   Musculoskeletal: Normal range of motion.  Neurological: She is alert and oriented to person, place, and time. She has normal reflexes.  Skin: Skin is warm and dry.  Psychiatric: She has a normal mood and affect. Her behavior is normal. Judgment and thought content normal.    MAU Course  Procedures  MDM IVFs: Phenergan 25 mg in LR 1000 ml @ 999 ml/hr; followed by MVI in LR 1000 ml @ 999 ml/hr -- no nausea/vomiting PO Challenge -- patient tolerated well, but "feeling a little queasy"  Results for orders placed or performed during the hospital encounter of 12/23/18 (from the past 24 hour(s))  Urinalysis, Routine w reflex microscopic     Status: Abnormal   Collection Time: 12/23/18  1:24 PM  Result Value Ref Range   Color, Urine YELLOW YELLOW   APPearance CLOUDY (A) CLEAR   Specific Gravity, Urine 1.012 1.005 - 1.030   pH 9.0 (H) 5.0 - 8.0   Glucose, UA NEGATIVE NEGATIVE mg/dL   Hgb urine dipstick NEGATIVE NEGATIVE   Bilirubin Urine NEGATIVE NEGATIVE   Ketones, ur 5 (A) NEGATIVE mg/dL   Protein, ur 30 (A) NEGATIVE mg/dL   Nitrite NEGATIVE NEGATIVE   Leukocytes,Ua NEGATIVE NEGATIVE   RBC / HPF 0-5 0 - 5 RBC/hpf   WBC, UA 0-5 0 - 5 WBC/hpf   Bacteria, UA RARE (A) NONE SEEN   Squamous Epithelial / LPF 0-5 0 - 5   Amorphous Crystal PRESENT     Assessment and Plan  Morning sickness  - Rx for Phenergan 25 mg every 6 hrs prn N/V - Information provided on morning sickness and a safe medication list for pregnancy   - Discharge patient - Keep scheduled appt with [facility] on 01/29/19 - Patient verbalized an understanding of the plan of care and agrees.   Laury Deep, MSN, CNM 12/23/2018, 2:20 PM

## 2018-12-26 MED FILL — METOCLOPRAMIDE 10 MG TABLET: 10 | 20 days supply | Qty: 60 | Fill #0

## 2018-12-28 MED FILL — PROPRANOLOL HCL ER 80 MG CP: 80 | 90 days supply | Qty: 90 | Fill #0

## 2019-01-07 MED FILL — OXYCODONE-ACETAMINOPHEN 5-3: 5-325 | 5 days supply | Qty: 20 | Fill #0

## 2019-02-20 ENCOUNTER — Other Ambulatory Visit: Payer: Self-pay | Admitting: Registered Nurse

## 2019-02-20 DIAGNOSIS — F411 Generalized anxiety disorder: Secondary | ICD-10-CM

## 2019-02-20 NOTE — Telephone Encounter (Signed)
Requested medication (s) are due for refill today: yes  Requested medication (s) are on the active medication list: yes  Last refill:  11/06/2018  Future visit scheduled: no  Notes to clinic:  Per note last visit Buspirone: Pt taking 10mg  bid for anxiety. Pregnancy category B medication, will reorder, but defer to [facility] decision regarding it's continuation through pregnancy.  Per Dr Orland Mustard.    Requested Prescriptions  Pending Prescriptions Disp Refills   busPIRone (BUSPAR) 10 MG tablet [Pharmacy Med Name: busPIRone HCL 10 MG TABS 10 TAB] 180 tablet 0    Sig: TAKE 1 TABLET (10 MG TOTAL) BY MOUTH 2 TIMES DAILY.     Psychiatry: Anxiolytics/Hypnotics - Non-controlled Passed - 02/20/2019  3:37 PM      Passed - Valid encounter within last 6 months    Recent Outpatient Visits          3 months ago Encounter to establish care   [facility] [place], NP   10 months ago Generalized anxiety disorder   [facility] [place], [place] D, PA-C   1 year ago Generalized anxiety disorder   [facility] [place], [place] D, PA-C   1 year ago Annual physical exam   [facility] [place], [place] D, [place]

## 2019-02-26 ENCOUNTER — Other Ambulatory Visit: Payer: Self-pay | Admitting: Registered Nurse

## 2019-02-26 DIAGNOSIS — F411 Generalized anxiety disorder: Secondary | ICD-10-CM

## 2019-02-26 MED ORDER — BUSPIRONE HCL 10 MG PO TABS: 10.0000 mg | ORAL_TABLET | Freq: Two times a day (BID) | ORAL | 0 refills | Status: DC

## 2019-02-26 MED FILL — busPIRone HCL 10 MG TABS: 10 | 90 days supply | Qty: 180 | Fill #0

## 2019-02-26 NOTE — Telephone Encounter (Signed)
Hi Mei, If you could give the patient a call and ask them to consult their OB regarding this medication - it's pregnancy category B, I'd prefer to delegate to [facility] for further refills. Thank you,  Kathrin Ruddy NP

## 2019-02-26 NOTE — Telephone Encounter (Signed)
I have attempted to call pt to ask the question from the provider below. There was no answer so I left a message to call back. I will try to call pt again in a few.

## 2019-02-26 NOTE — Telephone Encounter (Signed)
Please advise on refills. Pt is pregnant

## 2019-02-26 NOTE — Telephone Encounter (Signed)
Attempted to call pt and there was no answer once again so I have left a Vm to call back.   Plan is to relay the message from the provider below.

## 2019-03-30 MED FILL — PROPRANOLOL HCL ER 80 MG CP: 80 | 90 days supply | Qty: 90 | Fill #1

## 2019-03-31 MED FILL — NITROFURANTOIN MCR 100 MG C: 100 | 30 days supply | Qty: 30 | Fill #2

## 2019-06-09 ENCOUNTER — Encounter (HOSPITAL_COMMUNITY): Payer: Self-pay | Admitting: *Deleted

## 2019-06-09 ENCOUNTER — Telehealth (HOSPITAL_COMMUNITY): Payer: Self-pay | Admitting: *Deleted

## 2019-06-09 NOTE — Telephone Encounter (Signed)
Preadmission screen  

## 2019-06-12 ENCOUNTER — Other Ambulatory Visit (HOSPITAL_COMMUNITY)
Admission: RE | Admit: 2019-06-12 | Discharge: 2019-06-12 | Disposition: A | Discharge status: Home / Self Care | Payer: Managed Care, Other (non HMO) | Source: Ambulatory Visit | Attending: Obstetrics and Gynecology | Admitting: Obstetrics and Gynecology

## 2019-06-12 ENCOUNTER — Other Ambulatory Visit: Payer: Self-pay | Admitting: Obstetrics and Gynecology

## 2019-06-12 DIAGNOSIS — Z01812 Encounter for preprocedural laboratory examination: Secondary | ICD-10-CM | POA: Insufficient documentation

## 2019-06-12 DIAGNOSIS — Z20828 Contact with and (suspected) exposure to other viral communicable diseases: Secondary | ICD-10-CM | POA: Insufficient documentation

## 2019-06-12 LAB — SARS CORONAVIRUS 2 (TAT 6-24 HRS): SARS Coronavirus 2: NEGATIVE

## 2019-06-14 ENCOUNTER — Encounter (HOSPITAL_COMMUNITY): Payer: Self-pay | Admitting: Obstetrics and Gynecology

## 2019-06-14 ENCOUNTER — Inpatient Hospital Stay (HOSPITAL_COMMUNITY): Payer: Managed Care, Other (non HMO) | Admitting: Anesthesiology

## 2019-06-14 ENCOUNTER — Inpatient Hospital Stay (HOSPITAL_COMMUNITY)
Admission: AD | Admit: 2019-06-14 | Discharge: 2019-06-16 | DRG: 806 | Disposition: A | Discharge status: Home / Self Care | Payer: Managed Care, Other (non HMO) | Attending: Obstetrics and Gynecology | Admitting: Obstetrics and Gynecology

## 2019-06-14 ENCOUNTER — Other Ambulatory Visit: Payer: Self-pay

## 2019-06-14 ENCOUNTER — Inpatient Hospital Stay (HOSPITAL_COMMUNITY): Payer: Managed Care, Other (non HMO)

## 2019-06-14 DIAGNOSIS — Z20828 Contact with and (suspected) exposure to other viral communicable diseases: Secondary | ICD-10-CM | POA: Diagnosis present

## 2019-06-14 DIAGNOSIS — Z8759 Personal history of other complications of pregnancy, childbirth and the puerperium: Secondary | ICD-10-CM

## 2019-06-14 DIAGNOSIS — O99344 Other mental disorders complicating childbirth: Secondary | ICD-10-CM | POA: Diagnosis present

## 2019-06-14 DIAGNOSIS — O210 Mild hyperemesis gravidarum: Secondary | ICD-10-CM

## 2019-06-14 DIAGNOSIS — O26893 Other specified pregnancy related conditions, third trimester: Secondary | ICD-10-CM | POA: Diagnosis present

## 2019-06-14 DIAGNOSIS — O365990 Maternal care for other known or suspected poor fetal growth, unspecified trimester, not applicable or unspecified: Secondary | ICD-10-CM | POA: Diagnosis present

## 2019-06-14 DIAGNOSIS — O365930 Maternal care for other known or suspected poor fetal growth, third trimester, not applicable or unspecified: Secondary | ICD-10-CM | POA: Diagnosis present

## 2019-06-14 DIAGNOSIS — D259 Leiomyoma of uterus, unspecified: Secondary | ICD-10-CM | POA: Diagnosis present

## 2019-06-14 DIAGNOSIS — Z3A38 38 weeks gestation of pregnancy: Secondary | ICD-10-CM

## 2019-06-14 DIAGNOSIS — O3413 Maternal care for benign tumor of corpus uteri, third trimester: Secondary | ICD-10-CM | POA: Diagnosis present

## 2019-06-14 DIAGNOSIS — F411 Generalized anxiety disorder: Secondary | ICD-10-CM | POA: Diagnosis present

## 2019-06-14 LAB — CBC
HCT: 41.8 % (ref 36.0–46.0)
Hemoglobin: 13.8 g/dL (ref 12.0–15.0)
MCH: 29.7 pg (ref 26.0–34.0)
MCHC: 33 g/dL (ref 30.0–36.0)
MCV: 90.1 fL (ref 80.0–100.0)
Platelets: 173 10*3/uL (ref 150–400)
RBC: 4.64 MIL/uL (ref 3.87–5.11)
RDW: 14.6 % (ref 11.5–15.5)
WBC: 10.1 10*3/uL (ref 4.0–10.5)
nRBC: 0 % (ref 0.0–0.2)

## 2019-06-14 LAB — RPR: RPR Ser Ql: NONREACTIVE

## 2019-06-14 LAB — ABO/RH: ABO/RH(D): O POS

## 2019-06-14 LAB — TYPE AND SCREEN
ABO/RH(D): O POS
Antibody Screen: NEGATIVE

## 2019-06-14 MED ORDER — SIMETHICONE 80 MG PO CHEW: 80.0000 mg | CHEWABLE_TABLET | ORAL | Status: DC | PRN

## 2019-06-14 MED ORDER — OXYTOCIN 40 UNITS IN NORMAL SALINE INFUSION - SIMPLE MED
1.0000 m[IU]/min | INTRAVENOUS | Status: DC
  Administered 2019-06-14: 2 m[IU]/min via INTRAVENOUS
  Filled 2019-06-14: qty 1000

## 2019-06-14 MED ORDER — ACETAMINOPHEN 325 MG PO TABS: 650.0000 mg | ORAL_TABLET | ORAL | Status: DC | PRN

## 2019-06-14 MED ORDER — FENTANYL-BUPIVACAINE-NACL 0.5-0.125-0.9 MG/250ML-% EP SOLN
12.0000 mL/h | EPIDURAL | Status: DC | PRN
  Filled 2019-06-14: qty 250

## 2019-06-14 MED ORDER — PROPRANOLOL HCL ER 80 MG PO CP24
80.0000 mg | ORAL_CAPSULE | Freq: Every day | ORAL | Status: DC
  Administered 2019-06-14 – 2019-06-15 (×2): 80 mg via ORAL
  Filled 2019-06-14 (×4): qty 1

## 2019-06-14 MED ORDER — FLEET ENEMA 7-19 GM/118ML RE ENEM: 1.0000 | ENEMA | RECTAL | Status: DC | PRN

## 2019-06-14 MED ORDER — ACETAMINOPHEN 325 MG PO TABS
650.0000 mg | ORAL_TABLET | ORAL | Status: DC | PRN
  Administered 2019-06-14: 650 mg via ORAL
  Filled 2019-06-14: qty 2

## 2019-06-14 MED ORDER — ONDANSETRON HCL 4 MG PO TABS: 4.0000 mg | ORAL_TABLET | ORAL | Status: DC | PRN

## 2019-06-14 MED ORDER — DIPHENHYDRAMINE HCL 25 MG PO CAPS: 25.0000 mg | ORAL_CAPSULE | Freq: Four times a day (QID) | ORAL | Status: DC | PRN

## 2019-06-14 MED ORDER — EPHEDRINE 5 MG/ML INJ: 10.0000 mg | INTRAVENOUS | Status: DC | PRN

## 2019-06-14 MED ORDER — LACTATED RINGERS IV SOLN: 500.0000 mL | INTRAVENOUS | Status: DC | PRN

## 2019-06-14 MED ORDER — ONDANSETRON HCL 4 MG/2ML IJ SOLN: 4.0000 mg | INTRAMUSCULAR | Status: DC | PRN

## 2019-06-14 MED ORDER — BUTORPHANOL TARTRATE 1 MG/ML IJ SOLN
1.0000 mg | INTRAMUSCULAR | Status: DC | PRN
  Administered 2019-06-14: 14:00:00 1 mg via INTRAVENOUS
  Filled 2019-06-14: qty 1

## 2019-06-14 MED ORDER — DOCUSATE SODIUM 100 MG PO CAPS
100.0000 mg | ORAL_CAPSULE | Freq: Two times a day (BID) | ORAL | Status: DC
  Administered 2019-06-14 – 2019-06-16 (×3): 100 mg via ORAL
  Filled 2019-06-14 (×4): qty 1

## 2019-06-14 MED ORDER — LACTATED RINGERS IV SOLN: INTRAVENOUS | Status: DC

## 2019-06-14 MED ORDER — DIPHENHYDRAMINE HCL 50 MG/ML IJ SOLN: 12.5000 mg | INTRAMUSCULAR | Status: DC | PRN

## 2019-06-14 MED ORDER — ZOLPIDEM TARTRATE 5 MG PO TABS: 5.0000 mg | ORAL_TABLET | Freq: Every evening | ORAL | Status: DC | PRN

## 2019-06-14 MED ORDER — SOD CITRATE-CITRIC ACID 500-334 MG/5ML PO SOLN: 30.0000 mL | ORAL | Status: DC | PRN

## 2019-06-14 MED ORDER — ONDANSETRON HCL 4 MG/2ML IJ SOLN: 4.0000 mg | Freq: Four times a day (QID) | INTRAMUSCULAR | Status: DC | PRN

## 2019-06-14 MED ORDER — TETANUS-DIPHTH-ACELL PERTUSSIS 5-2.5-18.5 LF-MCG/0.5 IM SUSP: 0.5000 mL | Freq: Once | INTRAMUSCULAR | Status: DC

## 2019-06-14 MED ORDER — PRENATAL MULTIVITAMIN CH
1.0000 | ORAL_TABLET | Freq: Every day | ORAL | Status: DC
  Administered 2019-06-15 – 2019-06-16 (×2): 1 via ORAL
  Filled 2019-06-14 (×2): qty 1

## 2019-06-14 MED ORDER — TERBUTALINE SULFATE 1 MG/ML IJ SOLN: 0.2500 mg | Freq: Once | INTRAMUSCULAR | Status: DC | PRN

## 2019-06-14 MED ORDER — WITCH HAZEL-GLYCERIN EX PADS: 1.0000 "application " | MEDICATED_PAD | CUTANEOUS | Status: DC | PRN

## 2019-06-14 MED ORDER — LIDOCAINE HCL (PF) 1 % IJ SOLN
30.0000 mL | INTRAMUSCULAR | Status: AC | PRN
  Administered 2019-06-14: 5 mL via SUBCUTANEOUS

## 2019-06-14 MED ORDER — DIBUCAINE (PERIANAL) 1 % EX OINT: 1.0000 "application " | TOPICAL_OINTMENT | CUTANEOUS | Status: DC | PRN

## 2019-06-14 MED ORDER — OXYTOCIN BOLUS FROM INFUSION
500.0000 mL | Freq: Once | INTRAVENOUS | Status: AC
  Administered 2019-06-14: 16:00:00 500 mL via INTRAVENOUS

## 2019-06-14 MED ORDER — SODIUM CHLORIDE (PF) 0.9 % IJ SOLN
INTRAMUSCULAR | Status: DC | PRN
  Administered 2019-06-14: 12 mL/h via EPIDURAL

## 2019-06-14 MED ORDER — BENZOCAINE-MENTHOL 20-0.5 % EX AERO
1.0000 "application " | INHALATION_SPRAY | CUTANEOUS | Status: DC | PRN
  Administered 2019-06-14: 1 via TOPICAL
  Filled 2019-06-14: qty 56

## 2019-06-14 MED ORDER — OXYCODONE-ACETAMINOPHEN 5-325 MG PO TABS: 1.0000 | ORAL_TABLET | ORAL | Status: DC | PRN

## 2019-06-14 MED ORDER — OXYCODONE-ACETAMINOPHEN 5-325 MG PO TABS: 2.0000 | ORAL_TABLET | ORAL | Status: DC | PRN

## 2019-06-14 MED ORDER — SENNOSIDES-DOCUSATE SODIUM 8.6-50 MG PO TABS
2.0000 | ORAL_TABLET | ORAL | Status: DC
  Administered 2019-06-14 – 2019-06-15 (×2): 2 via ORAL
  Filled 2019-06-14 (×2): qty 2

## 2019-06-14 MED ORDER — COCONUT OIL OIL: 1.0000 "application " | TOPICAL_OIL | Status: DC | PRN

## 2019-06-14 MED ORDER — OXYTOCIN 40 UNITS IN NORMAL SALINE INFUSION - SIMPLE MED: 2.5000 [IU]/h | INTRAVENOUS | Status: DC

## 2019-06-14 MED ORDER — LACTATED RINGERS IV SOLN
500.0000 mL | Freq: Once | INTRAVENOUS | Status: AC
  Administered 2019-06-14: 14:00:00 500 mL via INTRAVENOUS

## 2019-06-14 MED ORDER — BUSPIRONE HCL 5 MG PO TABS
10.0000 mg | ORAL_TABLET | Freq: Two times a day (BID) | ORAL | Status: DC
  Administered 2019-06-15 – 2019-06-16 (×4): 10 mg via ORAL
  Filled 2019-06-14 (×4): qty 2

## 2019-06-14 MED ORDER — IBUPROFEN 600 MG PO TABS
600.0000 mg | ORAL_TABLET | Freq: Four times a day (QID) | ORAL | Status: DC
  Administered 2019-06-14 – 2019-06-16 (×8): 600 mg via ORAL
  Filled 2019-06-14 (×8): qty 1

## 2019-06-14 MED ORDER — PHENYLEPHRINE 40 MCG/ML (10ML) SYRINGE FOR IV PUSH (FOR BLOOD PRESSURE SUPPORT): 80.0000 ug | PREFILLED_SYRINGE | INTRAVENOUS | Status: DC | PRN

## 2019-06-14 NOTE — H&P (Signed)
Dorothy Fernandez is a 33 y.o. female presenting for scheduled IOL. +FM, denies Vb, LOF, irregular contractions.   PNC c/b fibroids, infertility (on Letrozole)  and IUGR @ [id number] %tile (uncomplicated), paroxsysmal SVT on propranolol, GAD on buspar. OB History    Gravida  2   Para  0   Term      Preterm      AB  1   Living  0     SAB      TAB      Ectopic  1   Multiple      Live Births           Obstetric Comments  Treated with methotrexate       Past Medical History:  Diagnosis Date  . Anxiety   . Fibroid    big enough might obstruct labor  . Nephrolithiasis   . Tachycardia    Past Surgical History:  Procedure Laterality Date  . TONSILLECTOMY     Family History: family history includes Cancer in her maternal grandmother; Diabetes in her maternal grandfather; Hyperlipidemia in her mother. Social History:  reports that she has never smoked. She has never used smokeless tobacco. She reports previous alcohol use. She reports that she does not use drugs.     Maternal Diabetes: No 1hr 167 >  3hr WNL (91/156/155/137) Genetic Screening: Normal Maternal Ultrasounds/Referrals: IUGR Fetal Ultrasounds or other Referrals:  Other: BPP 8/8 Maternal Substance Abuse:  No Significant Maternal Medications:  Meds include: Other: Buspar, propranolol Significant Maternal Lab Results:  Group B Strep negative Other Comments:  None  Review of Systems  Constitutional: Negative for chills and fever.  Respiratory: Negative for shortness of breath.   Cardiovascular: Negative for chest pain, palpitations and leg swelling.  Gastrointestinal: Negative for abdominal pain, nausea and vomiting.  Genitourinary: Positive for pelvic pain.  Neurological: Negative for dizziness, weakness and headaches.  Psychiatric/Behavioral: Negative for suicidal ideas.   Maternal Medical History:  Reason for admission: Nausea. Scheduled IOL for IUGR  Contractions: Frequency: irregular.    Fetal  activity: Perceived fetal activity is normal.    Prenatal complications: IUGR.   No PIH, polyhydramnios or preterm labor.   Prenatal Complications - Diabetes: none. Failed 1hr, 3hr WNL  Dilation: 3 Effacement (%): 50 Station: -2 Exam by:: Dorothy Fernandez Blood pressure 121/74, pulse (!) 103, temperature 98.2 F (36.8 C), temperature source Oral, resp. rate 16, last menstrual period 03/28/2018. Exam Physical Exam  Constitutional: She is oriented to person, place, and time. She appears well-developed and well-nourished. No distress.  HENT:  Head: Normocephalic and atraumatic.  Eyes: Pupils are equal, round, and reactive to light.  Cardiovascular: Normal rate and regular rhythm. Exam reveals no gallop.  No murmur heard. GI: There is abdominal tenderness (w/ ctx). There is no rebound and no guarding.  Genitourinary:    Vagina and uterus normal.   Musculoskeletal:        General: Normal range of motion.     Cervical back: Normal range of motion and neck supple.  Neurological: She is alert and oriented to person, place, and time.  Skin: Skin is warm and dry.    Prenatal labs: ABO, Rh: --/--/O POS, O POS Performed at [facility], [place] [address], [place], [place] [address]  [phone]) Antibody: NEG (12/26 0930) Rubella: Immune (06/04 0000) RPR: Nonreactive (06/04 0000)  HBsAg: Negative (06/04 0000)  HIV: Non-reactive (06/04 0000)  GBS:   neg  Category 1 tracing, baseline 120bpm, +  accels, no decels Irritability on TOCO  Clear AROm at 1105  Assessment/Plan: This is a 33yo G1P0 @ 3 5/7 admitted for IOL for IOL for IUGR @ 10th %tile. PNC c/b fibroids, infertility, GAD, paroxysxmal SVT  Admit to L&D, initiate and titrate pitocin per protocol. CLD, CEFM. Patient declining epidural for now but may reconsider. GBS neg. Baby boy oliver, does not want circ.   Melida Quitter Dorothy Fernandez 06/14/2019, 11:08 AM

## 2019-06-14 NOTE — Anesthesia Preprocedure Evaluation (Signed)
Anesthesia Evaluation  Patient identified by MRN, date of birth, ID band Patient awake    Reviewed: Allergy & Precautions, Patient's Chart, lab work & pertinent test results  Airway Mallampati: I       Dental   Pulmonary neg pulmonary ROS,    Pulmonary exam normal        Cardiovascular negative cardio ROS Normal cardiovascular exam     Neuro/Psych Anxiety negative neurological ROS     GI/Hepatic   Endo/Other    Renal/GU      Musculoskeletal   Abdominal   Peds  Hematology   Anesthesia Other Findings   Reproductive/Obstetrics (+) Pregnancy                             Anesthesia Physical Anesthesia Plan  ASA: II  Anesthesia Plan: Epidural   Post-op Pain Management:    Induction:   PONV Risk Score and Plan:   Airway Management Planned: Natural Airway  Additional Equipment: None  Intra-op Plan:   Post-operative Plan:   Informed Consent: I have reviewed the patients History and Physical, chart, labs and discussed the procedure including the risks, benefits and alternatives for the proposed anesthesia with the patient or authorized representative who has indicated his/her understanding and acceptance.       Plan Discussed with:   Anesthesia Plan Comments: (Lab Results      Component                Value               Date                      WBC                      10.1                06/14/2019                HGB                      13.8                06/14/2019                HCT                      41.8                06/14/2019                MCV                      90.1                06/14/2019                PLT                      173                 06/14/2019           )        Anesthesia Quick Evaluation

## 2019-06-14 NOTE — Anesthesia Procedure Notes (Signed)
Epidural Patient location during procedure: OB Start time: 06/14/2019 2:49 PM End time: 06/14/2019 2:55 PM  Staffing Anesthesiologist: Effie Berkshire, MD Performed: anesthesiologist   Preanesthetic Checklist Completed: patient identified, IV checked, site marked, risks and benefits discussed, surgical consent, monitors and equipment checked, pre-op evaluation and timeout performed  Epidural Patient position: sitting Prep: ChloraPrep Patient monitoring: heart rate, continuous pulse ox and blood pressure Approach: midline Location: L3-L4 Injection technique: LOR saline  Needle:  Needle type: Tuohy  Needle gauge: 17 G Needle length: 9 cm Catheter type: closed end flexible Catheter size: 20 Guage Test dose: negative and 1.5% lidocaine  Assessment Events: blood not aspirated, injection not painful, no injection resistance and no paresthesia  Additional Notes LOR @ 4  Patient identified. Risks/Benefits/Options discussed with patient including but not limited to bleeding, infection, nerve damage, paralysis, failed block, incomplete pain control, headache, blood pressure changes, nausea, vomiting, reactions to medications, itching and postpartum back pain. Confirmed with bedside nurse the patient's most recent platelet count. Confirmed with patient that they are not currently taking any anticoagulation, have any bleeding history or any family history of bleeding disorders. Patient expressed understanding and wished to proceed. All questions were answered. Sterile technique was used throughout the entire procedure. Please see nursing notes for vital signs. Test dose was given through epidural catheter and negative prior to continuing to dose epidural or start infusion. Warning signs of high block given to the patient including shortness of breath, tingling/numbness in hands, complete motor block, or any concerning symptoms with instructions to call for help. Patient was given instructions  on fall risk and not to get out of bed. All questions and concerns addressed with instructions to call with any issues or inadequate analgesia.    Reason for block:procedure for pain

## 2019-06-15 DIAGNOSIS — O3410 Maternal care for benign tumor of corpus uteri, unspecified trimester: Secondary | ICD-10-CM | POA: Insufficient documentation

## 2019-06-15 DIAGNOSIS — D259 Leiomyoma of uterus, unspecified: Secondary | ICD-10-CM | POA: Insufficient documentation

## 2019-06-15 LAB — CBC
HCT: 32.8 % — ABNORMAL LOW (ref 36.0–46.0)
Hemoglobin: 10.9 g/dL — ABNORMAL LOW (ref 12.0–15.0)
MCH: 29.8 pg (ref 26.0–34.0)
MCHC: 33.2 g/dL (ref 30.0–36.0)
MCV: 89.6 fL (ref 80.0–100.0)
Platelets: 153 10*3/uL (ref 150–400)
RBC: 3.66 MIL/uL — ABNORMAL LOW (ref 3.87–5.11)
RDW: 14.5 % (ref 11.5–15.5)
WBC: 14.2 10*3/uL — ABNORMAL HIGH (ref 4.0–10.5)
nRBC: 0 % (ref 0.0–0.2)

## 2019-06-15 NOTE — Lactation Note (Signed)
This note was copied from a baby's chart. Lactation Consultation Note  Patient Name: Dorothy Fernandez [id number] Date: 06/15/2019 Reason for consult: Follow-up assessment;Mother's request;Primapara;1st time breastfeeding;Infant < 6lbs  Dorothy Fernandez paged lactation for follow up. She states that baby Dorothy Fernandez has been sleepy today and has latched briefly with the use of a nipple shield today. Her records indicate primarily breast feeding attempts and spoon feeding. She has a DEBP set up at the bedside, and she has used it four times today. She is aware that she should be pumping every three hours; she states that it has been a busy day and she has occasionally lost track of time.  Dorothy Fernandez is requesting supplementation for her son. He is now 76 hours old. I offered to assist with waking and latching him first, and she responded that she would prefer to provide formula at this time. She had a bottle in the room with a slow flow purple nipple, but she had not given it.   I demonstrated how to pace bottle feed Dorothy Fernandez. Baby was extremely sleepy, but he did take 10 mls with the bottle. Dorothy Fernandez and her support person expressed relief to see him eat. He continued to sleep during and after the feeding.  I recommended that she pump for 15 minutes (both breasts) to stimulate. She verbalized understanding.  For future feedings, I suggested that she breast feed baby first and offer supplementation after. She had the supplementation amount guidelines in the room, and I reviewed day 2 supplementation amounts.  I recommended continuing to pump q 3 hours and to use the NS when latching, as it appears to help, per mom. I offered reassurance that the NS is a teaching aid for latching.  I recommend additional LC support to assist with latching baby as he begins to wake up and cue more. I also think Dorothy Fernandez would be a good candidate for an OP consult, and I put in a referral.  Maternal Data Has patient been taught Hand  Expression?: Yes Does the patient have breastfeeding experience prior to this delivery?: No   Interventions Interventions: Breast feeding basics reviewed  Lactation Tools Discussed/Used Tools: Bottle Pump Review: Setup, frequency, and cleaning   Consult Status Consult Status: Follow-up Date: 06/16/19 Follow-up type: In-patient    Lenore Manner 06/15/2019, 9:40 PM

## 2019-06-15 NOTE — Lactation Note (Addendum)
This note was copied from a baby's chart. Lactation Consultation Note  Patient Name: Dorothy Fernandez [id number] Date: 06/15/2019 Reason for consult: Initial assessment;1st time breastfeeding;Early term 37-38.6wks;Infant < 6lbs P1, 9 hour female infant. Infant had one stool since birth. Per mom, she has a DEBP at home.  Per parents, infant is not latching at breast mom has made three attempts prior.  Tools given: 20 mm NS and DEBP due to infant not latching at breast, - having a recessive chin, infant  suckles  lower lip inward  and  has a tight jaw  LC notice infant has recessive chin and sucks on bottom lip very tight jaw. LC attempted suck training infant very tight and suckle only few times and stop. Mom attempted latch infant on left breast using the football hold, after multiple attempts mom was fitted with 20 mm NS, infant latched and suckle a few times, per mom,  this is the first time infant has latched to her breast , infant breastfeed for 5 minutes. Mom taught back hand expression and infant was given 6 mls of EBM. LC mention to parents that donor milk is option  due to infant having a poor latch, ETI and being less than 6 lbs parents will think about it. Mom will continue to work towards infant latching at breast and will continue to use 20 mm NS, hand expressed afterwards and give infant back volume by spoon. Mom has breastfeeding supplemental guidelines sheet according to infant's age/ hours of life per feeding.  Mom knows to breastfeed infant according to hunger cues, 8 to 12 times within 24 hours, on demand and not exceed 3 hours without breastfeeding infant. Mom will use DEBP  Pumping both breast every 3 hours for 15 minutes on initial setting. Mom shown how to use DEBP & how to disassemble, clean, & reassemble parts. Mom knows to call RN or LC if she has any questions, concerns or need assistance with latching infant at breast. Reviewed Baby & Me book's Breastfeeding Basics.  Mom  made aware of O/P services, breastfeeding support groups, community resources, and our phone # for post-discharge questions.   Maternal Data Formula Feeding for Exclusion: No Has patient been taught Hand Expression?: Yes Does the patient have breastfeeding experience prior to this delivery?: No  Feeding Feeding Type: Breast Fed  LATCH Score Latch: Repeated attempts needed to sustain latch, nipple held in mouth throughout feeding, stimulation needed to elicit sucking reflex.  Audible Swallowing: A few with stimulation  Type of Nipple: Everted at rest and after stimulation  Comfort (Breast/Nipple): Soft / non-tender  Hold (Positioning): Assistance needed to correctly position infant at breast and maintain latch.  LATCH Score: 7  Interventions Interventions: Breast feeding basics reviewed;Breast compression;DEBP;Adjust position;Assisted with latch;Skin to skin;Support pillows;Breast massage;Position options;Hand express;Expressed milk  Lactation Tools Discussed/Used WIC Program: No Pump Review: Setup, frequency, and cleaning;Milk Storage Initiated by:: Vicente Serene, IBCLC Date initiated:: 06/15/19   Consult Status Consult Status: Follow-up Date: 06/15/19 Follow-up type: In-patient    Vicente Serene 06/15/2019, 2:09 AM

## 2019-06-15 NOTE — Progress Notes (Signed)
CSW received consult for MOB due to history of anxiety. CSW completed chart review and will screen out consult due to MOB actively taking 10mg  of Buspar to address her symptoms.  Please consult CSW if needs arise, MOB requests, or if she scores 10 or higher or answers yes to question ten on EDPS.  Dorothy Fernandez, MSW, LCSW-A Transitions of Care  Clinical Social Worker  [facility] Emergency Departments  Medical ICU [phone]

## 2019-06-15 NOTE — Anesthesia Postprocedure Evaluation (Signed)
Anesthesia Post Note  Patient: Dorothy Fernandez  Procedure(s) Performed: AN AD HOC LABOR EPIDURAL     Patient location during evaluation: Mother Baby Anesthesia Type: Epidural Level of consciousness: awake and awake and alert Pain management: satisfactory to patient Vital Signs Assessment: post-procedure vital signs reviewed and stable Respiratory status: spontaneous breathing Cardiovascular status: blood pressure returned to baseline and stable Postop Assessment: no headache and able to ambulate Anesthetic complications: no    Last Vitals:  Vitals:   06/15/19 0243 06/15/19 0548  BP: 103/65 103/64  Pulse: 87 81  Resp: 18 18  Temp: 36.7 C 36.6 C  SpO2: 96% 98%    Last Pain:  Vitals:   06/15/19 0548  TempSrc: Oral  PainSc: 1    Pain Goal:                   Henning Ehle

## 2019-06-15 NOTE — Progress Notes (Signed)
Notified pharmacy to change Mom's buspar medication to 0800/1400; called Dr Wilhelmenia Blase to request propranolol medication, which she takes every night at 2200.  New orders.

## 2019-06-15 NOTE — Progress Notes (Signed)
POSTPARTUM PROGRESS NOTE  Post Partum Day #1  Subjective:  No acute events overnight.  Pt denies problems with ambulating, voiding or po intake.  She denies nausea or vomiting.  Pain is well controlled. Lochia Minimal. Notes increase in pelvic pressure when upright but pain is controlled on PO meds, Dermaplast and spray bottle.   Objective: Blood pressure 103/64, pulse 81, temperature 97.8 F (36.6 C), temperature source Oral, resp. rate 18, height 5\' 3"  (1.6 m), weight 65.9 kg, last menstrual period 03/28/2018, SpO2 98 %, unknown if currently breastfeeding.  Physical Exam:  General: alert, cooperative and no distress Lochia:normal flow Chest: CTAB Heart: RRR no m/r/g Abdomen: +BS, soft, nontender Uterine Fundus: firm, 2cm below umbilicus GU: suture intact, healing well, no purulent drainage. Appropriately tender given recent repair Extremities:  edema, neg calf TTP BL, neg Homans BL  Recent Labs    06/14/19 0936 06/15/19 0528  HGB 13.8 10.9*  HCT 41.8 32.8*    Assessment/Plan:  ASSESSMENT: Dorothy Fernandez is a 33 y.o. G2P1011 s/p VAVD @ [redacted]w[redacted]d. PNC c/b infertility, fibroids, GAD, IUGR.   Plan for discharge tomorrow, Breastfeeding, Lactation consult and Contraception unsure   Does not want circ for baby boy.    LOS: 1 day

## 2019-06-16 MED ORDER — IBUPROFEN 600 MG PO TABS: 600.0000 mg | ORAL_TABLET | Freq: Four times a day (QID) | ORAL | 1 refills | Status: DC | PRN

## 2019-06-16 MED ORDER — BUSPIRONE HCL 10 MG PO TABS: 10.0000 mg | ORAL_TABLET | Freq: Two times a day (BID) | ORAL | 0 refills | Status: AC

## 2019-06-16 MED FILL — busPIRone HCL 10 MG TABS: 10 | 30 days supply | Qty: 60 | Fill #0

## 2019-06-16 MED FILL — IBUPROFEN 600 MG TABLET: 600 | 10 days supply | Qty: 40 | Fill #0

## 2019-06-16 NOTE — Progress Notes (Signed)
Patient ID: Dorothy Fernandez, female   DOB: January 09, 1986, 33 y.o.   MRN: [id number] Pt doing well. Reports surprise at how well vaginal pain well controlled. She is ambulating with voiding with no complaints. Lochia mild. Denies fever/chills/CP or SOB. She is bonding well with baby - breast/bottle feeding. She has no complaints. She is ready for discharge to home today though baby Dorothy Fernandez may need to stay an extra day. VSS  GEN - NAD ABD - FF and 2cm below umbilicus EXT - no homans   A/P: PPD#2 s/p VAVD - recovering well         Discharge instructions reviewed; may nest overnight         Will refill buspar; has enough propanolol at home. Rx for motrin sent

## 2019-06-16 NOTE — Progress Notes (Signed)
AVS printed and discharge instructions given to patient. Patient instructed to call for follow up appointment and to pick up prescriptions. Patient verbalized understanding and has no further questions.

## 2019-06-16 NOTE — Lactation Note (Signed)
This note was copied from a baby's chart. Lactation Consultation Note  Patient Name: Dorothy Fernandez Today's Date: 06/16/2019  P1, 62 hour ETI female infant, weight loss -2%. Parents current feeding choice is breast and formula feeding infant. Per mom, infant is starting to latch much better with 20 mm NS, he breastfeed at 8:30 and was supplemented with formula and he breastfeed less than 1 hour before LC entered room  45 minute feeding, but mom  did not supplement infant at that time.  LC suggested to parents to continue  supplement  Infant after breastfeeding infant at each feeding.  Mom's feeding plan: 1. Breastfeed according cues, 8 to 12 times within 24 hours, on demand and not exceed 3 hours without breastfeeding infant. 2. Mom will offer formula supplement at each feeding according to infant's age and hours of life. 3. Mom will use DEBP every 3 hours for 15 minutes on initial setting giving infant any EBM that is pumped.    Maternal Data    Feeding    LATCH Score                   Interventions    Lactation Tools Discussed/Used     Consult Status      Vicente Serene 06/16/2019, 1:39 AM

## 2019-06-16 NOTE — Lactation Note (Signed)
This note was copied from a baby's chart. Lactation Consultation Note:   Infant is 27 hours old. Mother is breastfeeding and supplementing infant with formula.  Mother reports that hand expression takes along time. She reports that she is pumping. Mother has a Medela pump at home.   Lab in to draw Bili. Father reports that mother is getting ready to breastfeed infant. Mother reports that her nipples are sore and that she is now using a #20 NS. Mother reports that she is not having difficulty with applying the shield.   LC offered to to assist mother with latching infant. Mother politely declined and reports that she would like to try on her own and will call for assistance as needed.  LC gave mother an extra NS.  Parents have supplemental guidelines. Discussion on increasing infants amt of supplement every 2-3 hours and with feeding cues.   Report was given to pt staff nurse Peter Congo. Suggested that she observe a feeding if in the room and observe if milk is in the [facility].     Patient Name: Dorothy Fernandez [id number] Date: 06/16/2019 Reason for consult: Follow-up assessment   Maternal Data    Feeding Feeding Type: Breast Fed Nipple Type: Slow - flow  LATCH Score                   Interventions Interventions: Skin to skin;Hand express  Lactation Tools Discussed/Used     Consult Status Consult Status: Follow-up Date: 06/16/19 Follow-up type: In-patient    Jess Barters [facility] 06/16/2019, 12:17 PM

## 2019-06-16 NOTE — Discharge Summary (Signed)
OB Discharge Summary     Patient Name: Dorothy Fernandez DOB: [birth year] MRN: [id number]  Date of admission: 06/14/2019 Delivering MD: Eula Flax M   Date of discharge: 06/16/2019  Admitting diagnosis: IUGR (intrauterine growth restriction) affecting care of mother [O36.5990] Intrauterine pregnancy: [redacted]w[redacted]d     Secondary diagnosis:  Active Problems:   IUGR (intrauterine growth restriction) affecting care of mother  Additional problems: none     Discharge diagnosis: s/p VAVD                                                                                                Post partum procedures:none  Augmentation: Pitocin  Complications: None  Hospital course:  Induction of Labor With Vaginal Delivery   33 y.o. yo G2P1011 at [redacted]w[redacted]d was admitted to the hospital 06/14/2019 for induction of labor.  Indication for induction: IUGR.  Patient had an uncomplicated labor course as follows: Membrane Rupture Time/Date: 11:05 AM ,06/14/2019   Intrapartum Procedures: Episiotomy: None [1]                                         Lacerations:  Sulcus [9];2nd degree [3];Perineal [11]  Patient had delivery of a Viable infant.  Information for the patient's newborn:  Phil, Macmillan [id number]  Delivery Method: Vaginal, Vacuum (Extractor)(Filed from Delivery Summary)    06/14/2019  Details of delivery can be found in separate delivery note.  Patient had a routine postpartum course. Patient is discharged home 06/16/19.  Physical exam  Vitals:   06/15/19 0548 06/15/19 1444 06/15/19 2104 06/16/19 0539  BP: 103/64 (!) 92/58 125/81 105/78  Pulse: 81 90 (!) 106 75  Resp: 18 18 18 18   Temp: 97.8 F (36.6 C) 98.3 F (36.8 C) 97.7 F (36.5 C) 98.3 F (36.8 C)  TempSrc: Oral Oral Oral   SpO2: 98% 98% 98% 97%  Weight:      Height:       General: alert, cooperative and no distress Lochia: appropriate Uterine Fundus: firm Incision: N/A DVT Evaluation: No evidence of DVT seen on physical  exam. Labs: Lab Results  Component Value Date   WBC 14.2 (H) 06/15/2019   HGB 10.9 (L) 06/15/2019   HCT 32.8 (L) 06/15/2019   MCV 89.6 06/15/2019   PLT 153 06/15/2019   CMP Latest Ref Rng & Units 12/21/2018  Glucose 70 - 99 mg/dL 112(H)  BUN 6 - 20 mg/dL 6  Creatinine 0.44 - 1.00 mg/dL 0.57  Sodium 135 - 145 mmol/L 135  Potassium 3.5 - 5.1 mmol/L 4.1  Chloride 98 - 111 mmol/L 106  CO2 22 - 32 mmol/L 21(L)  Calcium 8.9 - 10.3 mg/dL 9.6  Total Protein 6.5 - 8.1 g/dL 6.2(L)  Total Bilirubin 0.3 - 1.2 mg/dL 0.2(L)  Alkaline Phos 38 - 126 U/L 70  AST 15 - 41 U/L 14(L)  ALT 0 - 44 U/L 11    Discharge instruction: per After Visit Summary and "Baby and Me Booklet".  After visit meds:  Allergies as of 06/16/2019      Reactions   Cephalosporins Rash   Erythromycin Rash   Penicillins Rash   Sulfa Antibiotics Rash   Ceclor [cefaclor] Hives   Septra [sulfamethoxazole-trimethoprim]    Suprax [cefixime] Hives      Medication List    STOP taking these medications   nitrofurantoin (macrocrystal-monohydrate) 100 MG capsule Commonly known as: MACROBID     TAKE these medications   busPIRone 10 MG tablet Commonly known as: BUSPAR Take 1 tablet (10 mg total) by mouth 2 (two) times daily.   doxylamine (Sleep) 25 MG tablet Commonly known as: UNISOM Take 25 mg by mouth at bedtime as needed for sleep. Taking 1/2 tablet at bed time   ibuprofen 600 MG tablet Commonly known as: ADVIL Take 1 tablet (600 mg total) by mouth every 6 (six) hours as needed for moderate pain or cramping.   multivitamin capsule Take by mouth.   ondansetron 4 MG tablet Commonly known as: Zofran Take 1 tablet (4 mg total) by mouth every 8 (eight) hours as needed for nausea or vomiting.   promethazine 25 MG tablet Commonly known as: PHENERGAN Take 1 tablet (25 mg total) by mouth every 6 (six) hours as needed for nausea or vomiting.   triamcinolone cream 0.1 % Commonly known as: KENALOG daily.        Diet: routine diet  Activity: Advance as tolerated. Pelvic rest for 6 weeks.   Outpatient follow up:6 weeks Follow up Appt:No future appointments. Follow up Visit:No follow-ups on file.  Postpartum contraception: Not Discussed; got pregnant on letrozole  Newborn Data: Live born female  Birth Weight: 5 lb 11.9 oz (2605 g) APGAR: 8, 9  Newborn Delivery   Birth date/time: 06/14/2019 16:18:00 Delivery type: Vaginal, Vacuum (Extractor)      Baby Feeding: Bottle and Breast Disposition:rooming in   06/16/2019 Isaiah Serge, DO

## 2019-06-16 NOTE — Discharge Instructions (Signed)
Call office with any concerns [phone] 

## 2019-06-17 ENCOUNTER — Ambulatory Visit: Payer: Self-pay

## 2019-06-17 NOTE — Lactation Note (Signed)
This note was copied from a baby's chart. Lactation Consultation Note  Patient Name: Dorothy Fernandez [id number] Date: 06/17/2019 Reason for consult: Follow-up assessment Baby is 65 hours old/6% weight loss.  Double phototherapy was discontinued this morning and discharge planned.  Mom has been pumping and bottle feeding EBM and formula due to difficulty with latch.  Discussed milk coming to volume and the prevention and treatment of engorgement.  She has a breast pump at home and plans on pumping every 3 hours.  She has a nipple shield to use when latching baby.  Mom will continue to work on latching when home.  Questions answered.  Reviewed outpatient services and encouraged to call prn.  Maternal Data    Feeding    LATCH Score                   Interventions    Lactation Tools Discussed/Used     Consult Status Consult Status: Complete Follow-up type: Call as needed    Ave Filter 06/17/2019, 9:35 AM

## 2019-06-18 LAB — SURGICAL PATHOLOGY

## 2019-06-23 MED FILL — PROPRANOLOL HCL ER 80 MG CP: 80 | 34 days supply | Qty: 34 | Fill #2

## 2019-07-28 MED FILL — METOCLOPRAMIDE 10 MG TABLET: 10 | 33 days supply | Qty: 100 | Fill #0

## 2019-07-28 MED FILL — NITROFURANTOIN MONO-MCR 100: 100 | 90 days supply | Qty: 90 | Fill #0

## 2019-08-08 MED FILL — PROPRANOLOL HCL ER 80 MG CP: 80 | 34 days supply | Qty: 34 | Fill #3

## 2019-09-09 MED FILL — SERTRALINE HCL 50 MG TABLET: 50 | 30 days supply | Qty: 30 | Fill #0

## 2019-09-16 MED FILL — PROPRANOLOL HCL ER 80 MG CP: 80 | 34 days supply | Qty: 34 | Fill #4

## 2019-09-16 MED FILL — NITROFURANTOIN MONO-MCR 100: 100 | 90 days supply | Qty: 90 | Fill #0

## 2019-10-10 MED FILL — SERTRALINE HCL 50 MG TABLET: 50 | 30 days supply | Qty: 30 | Fill #1

## 2019-10-21 MED FILL — METOCLOPRAMIDE 10 MG TABLET: 10 | 20 days supply | Qty: 60 | Fill #1

## 2019-10-21 MED FILL — PROPRANOLOL HCL ER 80 MG CP: 80 | 34 days supply | Qty: 34 | Fill #5

## 2019-11-03 ENCOUNTER — Encounter: Payer: Self-pay | Admitting: Physician Assistant

## 2019-11-03 ENCOUNTER — Other Ambulatory Visit: Payer: Self-pay | Admitting: Physician Assistant

## 2019-11-03 ENCOUNTER — Ambulatory Visit (INDEPENDENT_AMBULATORY_CARE_PROVIDER_SITE_OTHER): Payer: No Typology Code available for payment source | Admitting: Physician Assistant

## 2019-11-03 ENCOUNTER — Other Ambulatory Visit: Payer: Self-pay

## 2019-11-03 VITALS — BP 110/76 | HR 72 | Temp 98.3°F | Ht 63.0 in | Wt 130.5 lb

## 2019-11-03 DIAGNOSIS — Z789 Other specified health status: Secondary | ICD-10-CM | POA: Diagnosis not present

## 2019-11-03 DIAGNOSIS — Z136 Encounter for screening for cardiovascular disorders: Secondary | ICD-10-CM

## 2019-11-03 DIAGNOSIS — R5383 Other fatigue: Secondary | ICD-10-CM | POA: Diagnosis not present

## 2019-11-03 DIAGNOSIS — F419 Anxiety disorder, unspecified: Secondary | ICD-10-CM

## 2019-11-03 DIAGNOSIS — Z0001 Encounter for general adult medical examination with abnormal findings: Secondary | ICD-10-CM | POA: Diagnosis not present

## 2019-11-03 DIAGNOSIS — H6123 Impacted cerumen, bilateral: Secondary | ICD-10-CM | POA: Diagnosis not present

## 2019-11-03 DIAGNOSIS — R000 Tachycardia, unspecified: Secondary | ICD-10-CM | POA: Insufficient documentation

## 2019-11-03 DIAGNOSIS — N390 Urinary tract infection, site not specified: Secondary | ICD-10-CM | POA: Insufficient documentation

## 2019-11-03 DIAGNOSIS — Z13220 Encounter for screening for lipoid disorders: Secondary | ICD-10-CM | POA: Diagnosis not present

## 2019-11-03 LAB — COMPREHENSIVE METABOLIC PANEL
ALT: 11 U/L (ref 0–35)
AST: 15 U/L (ref 0–37)
Albumin: 4.5 g/dL (ref 3.5–5.2)
Alkaline Phosphatase: 67 U/L (ref 39–117)
BUN: 6 mg/dL (ref 6–23)
CO2: 25 mEq/L (ref 19–32)
Calcium: 9.3 mg/dL (ref 8.4–10.5)
Chloride: 106 mEq/L (ref 96–112)
Creatinine, Ser: 0.68 mg/dL (ref 0.40–1.20)
GFR: 99.06 mL/min (ref >60.00)
Glucose, Bld: 109 mg/dL — ABNORMAL HIGH (ref 70–99)
Potassium: 4.2 mEq/L (ref 3.5–5.1)
Sodium: 138 mEq/L (ref 135–145)
Total Bilirubin: 0.6 mg/dL (ref 0.2–1.2)
Total Protein: 6.6 g/dL (ref 6.0–8.3)

## 2019-11-03 LAB — LIPID PANEL
Cholesterol: 163 mg/dL (ref 0–200)
HDL: 55.4 mg/dL (ref >39.00)
LDL Cholesterol: 96 mg/dL (ref 0–99)
NonHDL: 107.44
Total CHOL/HDL Ratio: 3
Triglycerides: 59 mg/dL (ref 0.0–149.0)
VLDL: 11.8 mg/dL (ref 0.0–40.0)

## 2019-11-03 LAB — CBC WITH DIFFERENTIAL/PLATELET
Basophils Absolute: 0.1 10*3/uL (ref 0.0–0.1)
Basophils Relative: 0.8 % (ref 0.0–3.0)
Eosinophils Absolute: 0.2 10*3/uL (ref 0.0–0.7)
Eosinophils Relative: 3.4 % (ref 0.0–5.0)
HCT: 39.2 % (ref 36.0–46.0)
Hemoglobin: 13.3 g/dL (ref 12.0–15.0)
Lymphocytes Relative: 34.4 % (ref 12.0–46.0)
Lymphs Abs: 2.3 10*3/uL (ref 0.7–4.0)
MCHC: 34 g/dL (ref 30.0–36.0)
MCV: 88.7 fl (ref 78.0–100.0)
Monocytes Absolute: 0.4 10*3/uL (ref 0.1–1.0)
Monocytes Relative: 6.6 % (ref 3.0–12.0)
Neutro Abs: 3.7 10*3/uL (ref 1.4–7.7)
Neutrophils Relative %: 54.8 % (ref 43.0–77.0)
Platelets: 238 10*3/uL (ref 150.0–400.0)
RBC: 4.42 Mil/uL (ref 3.87–5.11)
RDW: 13.6 % (ref 11.5–15.5)
WBC: 6.8 10*3/uL (ref 4.0–10.5)

## 2019-11-03 MED ORDER — PROPRANOLOL HCL ER 80 MG PO CP24: 80.0000 mg | ORAL_CAPSULE | Freq: Every day | ORAL | 2 refills | Status: DC

## 2019-11-03 MED ORDER — SERTRALINE HCL 50 MG PO TABS: 50.0000 mg | ORAL_TABLET | Freq: Every day | ORAL | 2 refills | Status: DC

## 2019-11-03 MED FILL — SERTRALINE HCL 50 MG TABS: 50 | 90 days supply | Qty: 90 | Fill #0

## 2019-11-03 NOTE — Progress Notes (Deleted)
Dorothy Fernandez is a 34 y.o. female is here to establish care.  I acted as a Education administrator for [employer], PA-C Anselmo Pickler, LPN   History of Present Illness:   Chief Complaint  Patient presents with  . Establish Care    HPI  New patient  Patient is establishing care from Dorothy Coss, NP.   There are no preventive care reminders to display for this patient.  Past Medical History:  Diagnosis Date  . Anxiety   . Fibroid    big enough might obstruct labor  . Nephrolithiasis   . Tachycardia      Social History   Socioeconomic History  . Marital status: Married    Spouse name: Merrilee Seashore  . Number of children: 0  . Years of education: Not on file  . Highest education level: Doctorate  Occupational History  . Occupation: Pharmacist  Tobacco Use  . Smoking status: Never Smoker  . Smokeless tobacco: Never Used  Substance and Sexual Activity  . Alcohol use: Not Currently    Comment: 1 X a year  . Drug use: No  . Sexual activity: Yes    Partners: Male    Birth control/protection: None  Other Topics Concern  . Not on file  Social History Narrative  . Not on file   Social Determinants of Health   Financial Resource Strain:   . Difficulty of Paying Living Expenses:   Food Insecurity:   . Worried About Charity fundraiser in the Last Year:   . Arboriculturist in the Last Year:   Transportation Needs:   . Film/video editor (Medical):   Marland Kitchen Lack of Transportation (Non-Medical):   Physical Activity:   . Days of Exercise per Week:   . Minutes of Exercise per Session:   Stress:   . Feeling of Stress :   Social Connections: Slightly Isolated  . Frequency of Communication with Friends and Family: Twice a week  . Frequency of Social Gatherings with Friends and Family: Once a week  . Attends Religious Services: 1 to 4 times per year  . Active Member of Clubs or Organizations: No  . Attends Archivist Meetings: Never  . Marital Status: Married   Human resources officer Violence: Not At Risk  . Fear of Current or Ex-Partner: No  . Emotionally Abused: No  . Physically Abused: No  . Sexually Abused: No    Past Surgical History:  Procedure Laterality Date  . TONSILLECTOMY      Family History  Problem Relation Age of Onset  . Hyperlipidemia Mother   . Cancer Maternal Grandmother        Cervical  . Diabetes Maternal Grandfather     PMHx, SurgHx, SocialHx, FamHx, Medications, and Allergies were reviewed in the Visit Navigator and updated as appropriate.   Patient Active Problem List   Diagnosis Date Noted  . Uterine fibroids affecting pregnancy 06/15/2019  . IUGR (intrauterine growth restriction) affecting care of mother 06/14/2019    Social History   Tobacco Use  . Smoking status: Never Smoker  . Smokeless tobacco: Never Used  Substance Use Topics  . Alcohol use: Not Currently    Comment: 1 X a year  . Drug use: No    Current Medications and Allergies:    Current Outpatient Medications:  .  doxylamine, Sleep, (UNISOM) 25 MG tablet, Take 25 mg by mouth at bedtime as needed for sleep. Taking 1/2 tablet at bed time, Disp: , Rfl:  .  ibuprofen (ADVIL) 600 MG tablet, Take 1 tablet (600 mg total) by mouth every 6 (six) hours as needed for moderate pain or cramping., Disp: 40 tablet, Rfl: 1 .  metoCLOPramide (REGLAN) 10 MG tablet, Take 10 mg by mouth 3 (three) times daily as needed., Disp: , Rfl:  .  Multiple Vitamin (MULTIVITAMIN) capsule, Take by mouth., Disp: , Rfl:  .  ondansetron (ZOFRAN) 4 MG tablet, Take 1 tablet (4 mg total) by mouth every 8 (eight) hours as needed for nausea or vomiting., Disp: 20 tablet, Rfl: 0 .  promethazine (PHENERGAN) 25 MG tablet, Take 1 tablet (25 mg total) by mouth every 6 (six) hours as needed for nausea or vomiting., Disp: 40 tablet, Rfl: 0 .  propranolol ER (INDERAL LA) 80 MG 24 hr capsule, Take 80 mg by mouth daily., Disp: , Rfl:  .  sertraline (ZOLOFT) 50 MG tablet, Take 50 mg by mouth at  bedtime., Disp: , Rfl:  .  triamcinolone cream (KENALOG) 0.1 %, daily., Disp: , Rfl:    Allergies  Allergen Reactions  . Cephalosporins Rash  . Erythromycin Rash  . Penicillins Rash  . Sulfa Antibiotics Rash  . Ceclor [Cefaclor] Hives  . Septra [Sulfamethoxazole-Trimethoprim]   . Suprax [Cefixime] Hives    Review of Systems   ROS  Vitals:  There were no vitals filed for this visit.   There is no height or weight on file to calculate BMI.   Physical Exam:    Physical Exam   Assessment and Plan:    There are no diagnoses linked to this encounter.  . Reviewed expectations re: course of current medical issues. . Discussed self-management of symptoms. . Outlined signs and symptoms indicating need for more acute intervention. . Patient verbalized understanding and all questions were answered. . See orders for this visit as documented in the electronic medical record. . Patient received an After Visit Summary.  ***  Inda Coke, PA-C [place] 11/03/2019  Follow-up: No follow-ups on file.

## 2019-11-03 NOTE — Patient Instructions (Signed)
It was great to see you!  Please go to the lab for blood work.   Our office will call you with your results unless you have chosen to receive results via MyChart.  If your blood work is normal we will follow-up each year for physicals and as scheduled for chronic medical problems.  If anything is abnormal we will treat accordingly and get you in for a follow-up.  Take care,  Dorothy Fernandez    Health Maintenance, Female Adopting a healthy lifestyle and getting preventive care are important in promoting health and wellness. Ask your health care provider about:  The right schedule for you to have regular tests and exams.  Things you can do on your own to prevent diseases and keep yourself healthy. What should I know about diet, weight, and exercise? Eat a healthy diet   Eat a diet that includes plenty of vegetables, fruits, low-fat dairy products, and lean protein.  Do not eat a lot of foods that are high in solid fats, added sugars, or sodium. Maintain a healthy weight Body mass index (BMI) is used to identify weight problems. It estimates body fat based on height and weight. Your health care provider can help determine your BMI and help you achieve or maintain a healthy weight. Get regular exercise Get regular exercise. This is one of the most important things you can do for your health. Most adults should:  Exercise for at least 150 minutes each week. The exercise should increase your heart rate and make you sweat (moderate-intensity exercise).  Do strengthening exercises at least twice a week. This is in addition to the moderate-intensity exercise.  Spend less time sitting. Even light physical activity can be beneficial. Watch cholesterol and blood lipids Have your blood tested for lipids and cholesterol at 34 years of age, then have this test every 5 years. Have your cholesterol levels checked more often if:  Your lipid or cholesterol levels are high.  You are older than 34  years of age.  You are at high risk for heart disease. What should I know about cancer screening? Depending on your health history and family history, you may need to have cancer screening at various ages. This may include screening for:  Breast cancer.  Cervical cancer.  Colorectal cancer.  Skin cancer.  Lung cancer. What should I know about heart disease, diabetes, and high blood pressure? Blood pressure and heart disease  High blood pressure causes heart disease and increases the risk of stroke. This is more likely to develop in people who have high blood pressure readings, are of African descent, or are overweight.  Have your blood pressure checked: ? Every 3-5 years if you are 18-39 years of age. ? Every year if you are 40 years old or older. Diabetes Have regular diabetes screenings. This checks your fasting blood sugar level. Have the screening done:  Once every three years after age 40 if you are at a normal weight and have a low risk for diabetes.  More often and at a younger age if you are overweight or have a high risk for diabetes. What should I know about preventing infection? Hepatitis B If you have a higher risk for hepatitis B, you should be screened for this virus. Talk with your health care provider to find out if you are at risk for hepatitis B infection. Hepatitis C Testing is recommended for:  Everyone born from 1945 through 1965.  Anyone with known risk factors for hepatitis C. Sexually   transmitted infections (STIs)  Get screened for STIs, including gonorrhea and chlamydia, if: ? You are sexually active and are younger than 34 years of age. ? You are older than 34 years of age and your health care provider tells you that you are at risk for this type of infection. ? Your sexual activity has changed since you were last screened, and you are at increased risk for chlamydia or gonorrhea. Ask your health care provider if you are at risk.  Ask your health  care provider about whether you are at high risk for HIV. Your health care provider may recommend a prescription medicine to help prevent HIV infection. If you choose to take medicine to prevent HIV, you should first get tested for HIV. You should then be tested every 3 months for as long as you are taking the medicine. Pregnancy  If you are about to stop having your period (premenopausal) and you may become pregnant, seek counseling before you get pregnant.  Take 400 to 800 micrograms (mcg) of folic acid every day if you become pregnant.  Ask for birth control (contraception) if you want to prevent pregnancy. Osteoporosis and menopause Osteoporosis is a disease in which the bones lose minerals and strength with aging. This can result in bone fractures. If you are 65 years old or older, or if you are at risk for osteoporosis and fractures, ask your health care provider if you should:  Be screened for bone loss.  Take a calcium or vitamin D supplement to lower your risk of fractures.  Be given hormone replacement therapy (HRT) to treat symptoms of menopause. Follow these instructions at home: Lifestyle  Do not use any products that contain nicotine or tobacco, such as cigarettes, e-cigarettes, and chewing tobacco. If you need help quitting, ask your health care provider.  Do not use street drugs.  Do not share needles.  Ask your health care provider for help if you need support or information about quitting drugs. Alcohol use  Do not drink alcohol if: ? Your health care provider tells you not to drink. ? You are pregnant, may be pregnant, or are planning to become pregnant.  If you drink alcohol: ? Limit how much you use to 0-1 drink a day. ? Limit intake if you are breastfeeding.  Be aware of how much alcohol is in your drink. In the [place] one drink equals one 12 oz bottle of beer (355 mL), one 5 oz glass of wine (148 mL), or one 1 oz glass of hard liquor (44 mL). General  instructions  Schedule regular health, dental, and eye exams.  Stay current with your vaccines.  Tell your health care provider if: ? You often feel depressed. ? You have ever been abused or do not feel safe at home. Summary  Adopting a healthy lifestyle and getting preventive care are important in promoting health and wellness.  Follow your health care provider's instructions about healthy diet, exercising, and getting tested or screened for diseases.  Follow your health care provider's instructions on monitoring your cholesterol and blood pressure. This information is not intended to replace advice given to you by your health care provider. Make sure you discuss any questions you have with your health care provider. Document Revised: 05/29/2018 Document Reviewed: 05/29/2018 Elsevier Patient Education  2020 [employer]  

## 2019-11-03 NOTE — Progress Notes (Signed)
Dorothy Fernandez is a 34 y.o. female here to establish care and physical.  I acted as a Education administrator for [employer], PA-C Anselmo Pickler, LPN   History of Present Illness:   Chief Complaint  Patient presents with  . Establish Care  . Annual Exam    Not fasting    Acute Concerns: Popping sensation in ears -- intermittently, in bilateral ears. Was bothersome initially more in R ear but now in L. Doesn't suffer from allergies.  No recent travel or altitude changes. Denies hearing changes. Fatigue -- sleeping 12 hours/night. Takes a prenatal MVI and occasional B12 supplement. Feels like her mood is well controlled with the zoloft. Is currently taking Propranolol 80 mg daily (was prior on 120 mg daily.) Has 20.41 month old son at home. Tried to go back to work but she wasn't able to. Currently not breastfeeding.  Chronic Issues: Tachycardia -- takes propranolol 80 mg ER daily; has been on this for a few years. Had cardiology work-up with Dr. Wynonia Lawman and everything was normal. Feels like this regimen is working well for her. Anxiety -- takes zoloft 50 mg daily, was taking buspar prior to pregnancy but now is only on zoloft. Mood is currently well controlled.  Health Maintenance: Immunizations -- UTD Colonoscopy -- N/A Mammogram -- N/A PAP -- UTD see GYN Bone Density -- N/A Diet -- vegetarian diets; tofu and salads; eats a lot of fiber Caffeine intake -- coffee with creamer Sleep habits -- currently great Exercise -- walks at the park a few times a week Weight -- Weight: 130 lb 8 oz (59.2 kg)  Mood -- much improved with the zoloft Alcohol -- no excessive intake Tobacco -- no smoking   Depression screen PHQ 2/9 11/06/2018  Decreased Interest 0  Down, Depressed, Hopeless 0  PHQ - 2 Score 0  Altered sleeping -  Tired, decreased energy -  Change in appetite -  Feeling bad or failure about yourself  -  Trouble concentrating -  Moving slowly or fidgety/restless -  Suicidal thoughts -   PHQ-9 Score -  Difficult doing work/chores -    GAD 7 : Generalized Anxiety Score 11/03/2019 04/04/2018 12/28/2017 11/22/2017  Nervous, Anxious, on Edge 1 2 3 3   Control/stop worrying 0 2 3 3   Worry too much - different things 1 2 3 3   Trouble relaxing 1 2 3 3   Restless 0 0 0 3  Easily annoyed or irritable 0 0 0 1  Afraid - awful might happen 0 1 1 1   Total GAD 7 Score 3 9 13 17   Anxiety Difficulty Not difficult at all Not difficult at all Not difficult at all -     Other providers/specialists: Patient Care Team: Inda Coke, PA as PCP - General (Physician Assistant)   Past Medical History:  Diagnosis Date  . Anxiety   . Fibroid   . Nephrolithiasis   . Tachycardia      Social History   Socioeconomic History  . Marital status: Married    Spouse name: Merrilee Seashore  . Number of children: 0  . Years of education: Not on file  . Highest education level: Doctorate  Occupational History  . Occupation: Pharmacist  Tobacco Use  . Smoking status: Never Smoker  . Smokeless tobacco: Never Used  Substance and Sexual Activity  . Alcohol use: Not Currently    Comment: 1 X a year  . Drug use: No  . Sexual activity: Yes    Partners: Male  Birth control/protection: None  Other Topics Concern  . Not on file  Social History Narrative   Pharmacist   Married   4.5 months -- son   Social Determinants of Health   Financial Resource Strain:   . Difficulty of Paying Living Expenses:   Food Insecurity:   . Worried About Charity fundraiser in the Last Year:   . Arboriculturist in the Last Year:   Transportation Needs:   . Film/video editor (Medical):   Marland Kitchen Lack of Transportation (Non-Medical):   Physical Activity:   . Days of Exercise per Week:   . Minutes of Exercise per Session:   Stress:   . Feeling of Stress :   Social Connections: Slightly Isolated  . Frequency of Communication with Friends and Family: Twice a week  . Frequency of Social Gatherings with Friends and  Family: Once a week  . Attends Religious Services: 1 to 4 times per year  . Active Member of Clubs or Organizations: No  . Attends Archivist Meetings: Never  . Marital Status: Married  Human resources officer Violence: Not At Risk  . Fear of Current or Ex-Partner: No  . Emotionally Abused: No  . Physically Abused: No  . Sexually Abused: No    Past Surgical History:  Procedure Laterality Date  . TONSILLECTOMY      Family History  Problem Relation Age of Onset  . Hyperlipidemia Mother   . Cancer Maternal Grandmother        Cervical  . Diabetes Maternal Grandfather   . Colon cancer Neg Hx   . Breast cancer Neg Hx     Allergies  Allergen Reactions  . Cephalosporins Rash  . Erythromycin Rash  . Penicillins Rash  . Sulfa Antibiotics Rash  . Ceclor [Cefaclor] Hives  . Septra [Sulfamethoxazole-Trimethoprim]   . Suprax [Cefixime] Hives     Current Medications:   Current Outpatient Medications:  Marland Kitchen  Multiple Vitamin (MULTIVITAMIN) capsule, Take by mouth., Disp: , Rfl:  .  nitrofurantoin, macrocrystal-monohydrate, (MACROBID) 100 MG capsule, Take 100 mg by mouth daily as needed. Take after intercourse, Disp: , Rfl:  .  propranolol ER (INDERAL LA) 80 MG 24 hr capsule, Take 1 capsule (80 mg total) by mouth daily., Disp: 90 capsule, Rfl: 2 .  sertraline (ZOLOFT) 50 MG tablet, Take 1 tablet (50 mg total) by mouth at bedtime., Disp: 90 tablet, Rfl: 2 .  triamcinolone cream (KENALOG) 0.1 %, Apply 1 application topically as needed. , Disp: , Rfl:    Review of Systems:   Review of Systems  Constitutional: Negative for chills, fever, malaise/fatigue and weight loss.  HENT: Negative for hearing loss, sinus pain and sore throat.   Respiratory: Negative for cough and hemoptysis.   Cardiovascular: Negative for chest pain, palpitations, leg swelling and PND.  Gastrointestinal: Negative for abdominal pain, constipation, diarrhea, heartburn, nausea and vomiting.  Genitourinary:  Negative for dysuria, frequency and urgency.  Musculoskeletal: Negative for back pain, myalgias and neck pain.  Skin: Negative for itching and rash.  Neurological: Negative for dizziness, tingling, seizures and headaches.  Endo/Heme/Allergies: Negative for polydipsia.  Psychiatric/Behavioral: Negative for depression. The patient is not nervous/anxious.       Vitals:   Vitals:   11/03/19 1336  BP: 110/76  Pulse: 72  Temp: 98.3 F (36.8 C)  TempSrc: Temporal  SpO2: 99%  Weight: 130 lb 8 oz (59.2 kg)  Height: 5\' 3"  (1.6 m)      Body mass  index is 23.12 kg/m.  Physical Exam:   Physical Exam Vitals and nursing note reviewed.  Constitutional:      General: She is not in acute distress.    Appearance: Normal appearance. She is well-developed. She is not ill-appearing or toxic-appearing.  HENT:     Head: Normocephalic and atraumatic.     Right Ear: Ear canal and external ear normal. There is impacted cerumen.     Left Ear: Ear canal and external ear normal. There is impacted cerumen.  Eyes:     General: Lids are normal.     Conjunctiva/sclera: Conjunctivae normal.     Pupils: Pupils are equal, round, and reactive to light.  Neck:     Trachea: Trachea normal.  Cardiovascular:     Rate and Rhythm: Normal rate and regular rhythm.     Heart sounds: Normal heart sounds, S1 normal and S2 normal.  Pulmonary:     Effort: Pulmonary effort is normal. No tachypnea or respiratory distress.     Breath sounds: Normal breath sounds. No decreased breath sounds, wheezing, rhonchi or rales.  Abdominal:     General: Bowel sounds are normal.     Palpations: Abdomen is soft.     Tenderness: There is no abdominal tenderness.  Musculoskeletal:        General: Normal range of motion.     Cervical back: Full passive range of motion without pain.  Lymphadenopathy:     Cervical: No cervical adenopathy.  Skin:    General: Skin is warm and dry.  Neurological:     Mental Status: She is alert.      GCS: GCS eye subscore is 4. GCS verbal subscore is 5. GCS motor subscore is 6.     Cranial Nerves: No cranial nerve deficit.     Sensory: No sensory deficit.     Deep Tendon Reflexes: Reflexes are normal and symmetric.  Psychiatric:        Speech: Speech normal.        Behavior: Behavior normal. Behavior is cooperative.    Procedure: Cerumen Disimpaction Patient verbalized consent for procedure Warm water was applied and gentle ear lavage performed on the bilateral sides. There were no complications and following the disimpaction the tympanic membrane was visible on the bilateral. Tympanic membranes are intact following the procedure.  Auditory canals are normal.  The patient reported relief of symptoms after removal of cerumen.    Assessment and Plan:   Rosetter was seen today for establish care and annual exam.  Diagnoses and all orders for this visit:  Encounter for general adult medical examination with abnormal findings Today patient counseled on age appropriate routine health concerns for screening and prevention, each reviewed and up to date or declined. Immunizations reviewed and up to date or declined. Labs ordered and reviewed. Risk factors for depression reviewed and negative. Hearing function and visual acuity are intact. ADLs screened and addressed as needed. Functional ability and level of safety reviewed and appropriate. Education, counseling and referrals performed based on assessed risks today. Patient provided with a copy of personalized plan for preventive services.  Fatigue, unspecified type Will check labs for organic cause. May consider decreasing propranolol dose if her HR allows to see if this helps. Continue pushing fluids, good nutrition and adequate exercise. -     Vitamin B12 -     CBC with Differential/Platelet -     Comprehensive metabolic panel -     TSH  Vegetarian Update B12 level, will  recommend supplementation based upon results. -     Vitamin  B12  Bilateral impacted cerumen Tolerated procedure well without issues. Recommend flonase in bilateral nares x 1 week to see if helps with possible ETD that she is also experiencing.  Encounter for lipid screening for cardiovascular disease -     Lipid panel  Anxiety Currently well controlled with Zoloft 50 mg daily. Follow-up in 6 months, sooner if concerns.  Tachycardia Currently well controlled. If fatigue persists, may trial lower dose of propranolol if she would like.  Other orders -     sertraline (ZOLOFT) 50 MG tablet; Take 1 tablet (50 mg total) by mouth at bedtime. -     propranolol ER (INDERAL LA) 80 MG 24 hr capsule; Take 1 capsule (80 mg total) by mouth daily.    . Reviewed expectations re: course of current medical issues. . Discussed self-management of symptoms. . Outlined signs and symptoms indicating need for more acute intervention. . Patient verbalized understanding and all questions were answered. . See orders for this visit as documented in the electronic medical record. . Patient received an After-Visit Summary.  CMA or LPN served as scribe during this visit. History, Physical, and Plan performed by medical provider. The above documentation has been reviewed and is accurate and complete.   Inda Coke, PA-C

## 2019-11-04 LAB — VITAMIN B12: Vitamin B-12: 1474 pg/mL — ABNORMAL HIGH (ref 211–911)

## 2019-11-04 LAB — TSH: TSH: 0.77 u[IU]/mL (ref 0.35–4.50)

## 2019-11-12 MED FILL — SERTRALINE HCL 50 MG TABS: 50 | 30 days supply | Qty: 30 | Fill #2

## 2019-12-09 MED FILL — SERTRALINE HCL 50 MG TABS: 50 | 30 days supply | Qty: 30 | Fill #3

## 2019-12-29 MED FILL — PROPRANOLOL HCL ER 80 MG CP: 80 | 90 days supply | Qty: 90 | Fill #0

## 2020-01-12 MED FILL — SERTRALINE HCL 50 MG TABS: 50 | 30 days supply | Qty: 30 | Fill #4

## 2020-02-09 MED FILL — SERTRALINE HCL 50 MG TABLET: 50 | 30 days supply | Qty: 30 | Fill #5

## 2020-02-18 ENCOUNTER — Encounter: Payer: Self-pay | Admitting: Physician Assistant

## 2020-02-19 ENCOUNTER — Other Ambulatory Visit: Payer: Self-pay | Admitting: Physician Assistant

## 2020-02-19 MED ORDER — BUSPIRONE HCL 10 MG PO TABS: 10.0000 mg | ORAL_TABLET | Freq: Two times a day (BID) | ORAL | 1 refills | Status: DC

## 2020-02-19 MED FILL — busPIRone HCL 10 MG TABS: 10 | 90 days supply | Qty: 180 | Fill #0

## 2020-03-16 LAB — OB RESULTS CONSOLE HEPATITIS B SURFACE ANTIGEN: Hepatitis B Surface Ag: NEGATIVE

## 2020-03-16 LAB — CBC AND DIFFERENTIAL
HCT: 40 (ref 36–46)
Hemoglobin: 13.1 (ref 12.0–16.0)
Platelets: 240 (ref 150–399)

## 2020-03-16 LAB — HEPATITIS B SURFACE ANTIGEN: Hepatitis B Surface Ag: NEGATIVE

## 2020-03-16 LAB — OB RESULTS CONSOLE ABO/RH: RH Type: POSITIVE

## 2020-03-16 LAB — OB RESULTS CONSOLE RPR: RPR: NONREACTIVE

## 2020-03-16 LAB — OB RESULTS CONSOLE ANTIBODY SCREEN: Antibody Screen: NEGATIVE

## 2020-03-16 LAB — OB RESULTS CONSOLE GC/CHLAMYDIA
Chlamydia: NEGATIVE
Gonorrhea: NEGATIVE

## 2020-03-16 LAB — HIV ANTIBODY (ROUTINE TESTING W REFLEX): HIV 1&2 Ab, 4th Generation: NONREACTIVE

## 2020-03-16 LAB — OB RESULTS CONSOLE HIV ANTIBODY (ROUTINE TESTING): HIV: NONREACTIVE

## 2020-03-16 LAB — OB RESULTS CONSOLE RUBELLA ANTIBODY, IGM: Rubella: IMMUNE

## 2020-03-29 MED FILL — PROPRANOLOL HCL ER 80 MG CP: 80 | 90 days supply | Qty: 90 | Fill #1

## 2020-04-20 ENCOUNTER — Encounter: Payer: Self-pay | Admitting: Physician Assistant

## 2020-04-20 LAB — TYPE AND SCREEN
Antibody Screen: NEGATIVE
Type and Screen: O POS

## 2020-04-20 LAB — GC/CHLAMYDIA PROBE AMP
Chlamydia, Swab/Urine, PCR: NEGATIVE
GC Probe Amp, Urine: NEGATIVE

## 2020-06-19 NOTE — L&D Delivery Note (Signed)
DELIVERY NOTE  Pt complete and at +2 station with urge to push. Epidural controlling pain. Pt pushed and delivered a viable female infant in LOA position. Loose nuchal x1, reduced easily. Anterior and posterior shoulders spontaneously delivered with next two pushes; body easily followed next. Infant placed on mothers abdomen and bulb suction of mouth and nose performed. Cord was then clamped and cut by FOB. Cord blood obtained, 3VC very thin. Baby had a vigorous spontaneous cry noted. Placenta then delivered at 2230 intact. Fundal massage performed and pitocin per protocol. Fundus firm. The following lacerations were noted: 2nd deg. Repaired in routine fashion with 2-0 vicryl Mother and baby stable. Counts correct. EBL 300cc  Infant time: 2228 Gender: female Placenta time: 2230 Apgars: 8/9 Weight: pending skin-to-skin

## 2020-06-24 MED FILL — PROPRANOLOL HCL ER 80 MG CP: 80 | 90 days supply | Qty: 90 | Fill #2

## 2020-07-19 DIAGNOSIS — Z3689 Encounter for other specified antenatal screening: Secondary | ICD-10-CM | POA: Diagnosis not present

## 2020-07-19 DIAGNOSIS — Z23 Encounter for immunization: Secondary | ICD-10-CM | POA: Diagnosis not present

## 2020-07-19 DIAGNOSIS — O99342 Other mental disorders complicating pregnancy, second trimester: Secondary | ICD-10-CM | POA: Diagnosis not present

## 2020-07-19 DIAGNOSIS — Z3A27 27 weeks gestation of pregnancy: Secondary | ICD-10-CM | POA: Diagnosis not present

## 2020-07-24 MED FILL — SERTRALINE HCL 50 MG TABS: 50 | 90 days supply | Qty: 90 | Fill #0

## 2020-08-17 DIAGNOSIS — Z3A31 31 weeks gestation of pregnancy: Secondary | ICD-10-CM | POA: Diagnosis not present

## 2020-08-17 DIAGNOSIS — O99413 Diseases of the circulatory system complicating pregnancy, third trimester: Secondary | ICD-10-CM | POA: Diagnosis not present

## 2020-09-20 DIAGNOSIS — Z3A36 36 weeks gestation of pregnancy: Secondary | ICD-10-CM | POA: Diagnosis not present

## 2020-09-20 DIAGNOSIS — O365913 Maternal care for other known or suspected poor fetal growth, first trimester, fetus 3: Secondary | ICD-10-CM | POA: Diagnosis not present

## 2020-09-20 DIAGNOSIS — Z3685 Encounter for antenatal screening for Streptococcus B: Secondary | ICD-10-CM | POA: Diagnosis not present

## 2020-09-21 ENCOUNTER — Telehealth (HOSPITAL_COMMUNITY): Payer: Self-pay | Admitting: *Deleted

## 2020-09-21 ENCOUNTER — Encounter (HOSPITAL_COMMUNITY): Payer: Self-pay | Admitting: *Deleted

## 2020-09-21 NOTE — Telephone Encounter (Signed)
Preadmission screen  

## 2020-09-22 ENCOUNTER — Telehealth (HOSPITAL_COMMUNITY): Payer: Self-pay | Admitting: *Deleted

## 2020-09-22 NOTE — Telephone Encounter (Signed)
Preadmission screen  

## 2020-09-23 ENCOUNTER — Telehealth (HOSPITAL_COMMUNITY): Payer: Self-pay | Admitting: *Deleted

## 2020-09-23 ENCOUNTER — Other Ambulatory Visit: Payer: Self-pay | Admitting: Physician Assistant

## 2020-09-23 ENCOUNTER — Encounter (HOSPITAL_COMMUNITY): Payer: Self-pay | Admitting: *Deleted

## 2020-09-23 NOTE — Telephone Encounter (Signed)
Preadmission screen  

## 2020-09-24 ENCOUNTER — Other Ambulatory Visit (HOSPITAL_COMMUNITY): Payer: Self-pay

## 2020-09-24 MED ORDER — PROPRANOLOL HCL ER 80 MG PO CP24
ORAL_CAPSULE | Freq: Every day | ORAL | 0 refills | Status: DC
  Filled 2020-09-24: qty 90, 90d supply, fill #0

## 2020-09-25 ENCOUNTER — Other Ambulatory Visit: Payer: Self-pay | Admitting: Physician Assistant

## 2020-09-25 ENCOUNTER — Other Ambulatory Visit (HOSPITAL_COMMUNITY): Payer: Self-pay

## 2020-09-25 ENCOUNTER — Other Ambulatory Visit (HOSPITAL_COMMUNITY): Payer: Self-pay | Admitting: Pharmacy Technician

## 2020-09-25 MED ORDER — SERTRALINE HCL 50 MG PO TABS
50.0000 mg | ORAL_TABLET | ORAL | 1 refills | Status: DC
  Filled 2020-09-25: qty 90, 90d supply, fill #0

## 2020-09-27 ENCOUNTER — Other Ambulatory Visit: Payer: Self-pay

## 2020-09-27 ENCOUNTER — Inpatient Hospital Stay (HOSPITAL_COMMUNITY): Payer: 59 | Admitting: Anesthesiology

## 2020-09-27 ENCOUNTER — Inpatient Hospital Stay (HOSPITAL_COMMUNITY)
Admission: AD | Admit: 2020-09-27 | Discharge: 2020-09-29 | DRG: 805 | Disposition: A | Discharge status: Home / Self Care | Payer: 59 | Attending: Obstetrics and Gynecology | Admitting: Obstetrics and Gynecology

## 2020-09-27 ENCOUNTER — Encounter (HOSPITAL_COMMUNITY): Payer: Self-pay | Admitting: Obstetrics and Gynecology

## 2020-09-27 DIAGNOSIS — O9942 Diseases of the circulatory system complicating childbirth: Secondary | ICD-10-CM | POA: Diagnosis present

## 2020-09-27 DIAGNOSIS — O99344 Other mental disorders complicating childbirth: Secondary | ICD-10-CM | POA: Diagnosis present

## 2020-09-27 DIAGNOSIS — O365990 Maternal care for other known or suspected poor fetal growth, unspecified trimester, not applicable or unspecified: Secondary | ICD-10-CM | POA: Diagnosis present

## 2020-09-27 DIAGNOSIS — O358XX9 Maternal care for other (suspected) fetal abnormality and damage, other fetus: Secondary | ICD-10-CM | POA: Diagnosis not present

## 2020-09-27 DIAGNOSIS — I471 Supraventricular tachycardia: Secondary | ICD-10-CM | POA: Diagnosis not present

## 2020-09-27 DIAGNOSIS — D259 Leiomyoma of uterus, unspecified: Secondary | ICD-10-CM | POA: Diagnosis present

## 2020-09-27 DIAGNOSIS — O3413 Maternal care for benign tumor of corpus uteri, third trimester: Secondary | ICD-10-CM | POA: Diagnosis not present

## 2020-09-27 DIAGNOSIS — Z3A37 37 weeks gestation of pregnancy: Secondary | ICD-10-CM

## 2020-09-27 DIAGNOSIS — O365930 Maternal care for other known or suspected poor fetal growth, third trimester, not applicable or unspecified: Secondary | ICD-10-CM | POA: Diagnosis not present

## 2020-09-27 DIAGNOSIS — Z20822 Contact with and (suspected) exposure to covid-19: Secondary | ICD-10-CM | POA: Diagnosis not present

## 2020-09-27 DIAGNOSIS — F411 Generalized anxiety disorder: Secondary | ICD-10-CM | POA: Diagnosis present

## 2020-09-27 LAB — CBC
HCT: 38.7 % (ref 36.0–46.0)
Hemoglobin: 13.4 g/dL (ref 12.0–15.0)
MCH: 30.8 pg (ref 26.0–34.0)
MCHC: 34.6 g/dL (ref 30.0–36.0)
MCV: 89 fL (ref 80.0–100.0)
Platelets: 144 10*3/uL — ABNORMAL LOW (ref 150–400)
RBC: 4.35 MIL/uL (ref 3.87–5.11)
RDW: 13.5 % (ref 11.5–15.5)
WBC: 10 10*3/uL (ref 4.0–10.5)
nRBC: 0 % (ref 0.0–0.2)

## 2020-09-27 LAB — SARS CORONAVIRUS 2 (TAT 6-24 HRS): SARS Coronavirus 2: NEGATIVE

## 2020-09-27 LAB — TYPE AND SCREEN
ABO/RH(D): O POS
Antibody Screen: NEGATIVE

## 2020-09-27 MED ORDER — ONDANSETRON HCL 4 MG/2ML IJ SOLN: 4.0000 mg | Freq: Four times a day (QID) | INTRAMUSCULAR | Status: DC | PRN

## 2020-09-27 MED ORDER — SOD CITRATE-CITRIC ACID 500-334 MG/5ML PO SOLN: 30.0000 mL | ORAL | Status: DC | PRN

## 2020-09-27 MED ORDER — OXYTOCIN-SODIUM CHLORIDE 30-0.9 UT/500ML-% IV SOLN
1.0000 m[IU]/min | INTRAVENOUS | Status: DC
  Administered 2020-09-27: 2 m[IU]/min via INTRAVENOUS
  Filled 2020-09-27: qty 500

## 2020-09-27 MED ORDER — LACTATED RINGERS IV SOLN: 500.0000 mL | INTRAVENOUS | Status: DC | PRN

## 2020-09-27 MED ORDER — LACTATED RINGERS IV SOLN: 500.0000 mL | Freq: Once | INTRAVENOUS | Status: DC

## 2020-09-27 MED ORDER — FENTANYL-BUPIVACAINE-NACL 0.5-0.125-0.9 MG/250ML-% EP SOLN: 12.0000 mL/h | EPIDURAL | Status: DC | PRN

## 2020-09-27 MED ORDER — LIDOCAINE HCL (PF) 1 % IJ SOLN: 30.0000 mL | INTRAMUSCULAR | Status: DC | PRN

## 2020-09-27 MED ORDER — PHENYLEPHRINE 40 MCG/ML (10ML) SYRINGE FOR IV PUSH (FOR BLOOD PRESSURE SUPPORT): 80.0000 ug | PREFILLED_SYRINGE | INTRAVENOUS | Status: DC | PRN

## 2020-09-27 MED ORDER — OXYTOCIN-SODIUM CHLORIDE 30-0.9 UT/500ML-% IV SOLN
2.5000 [IU]/h | INTRAVENOUS | Status: DC
  Administered 2020-09-27: 2.5 [IU]/h via INTRAVENOUS

## 2020-09-27 MED ORDER — LIDOCAINE HCL (PF) 1 % IJ SOLN
INTRAMUSCULAR | Status: DC | PRN
  Administered 2020-09-27: 5 mL via EPIDURAL

## 2020-09-27 MED ORDER — FLEET ENEMA 7-19 GM/118ML RE ENEM: 1.0000 | ENEMA | Freq: Every day | RECTAL | Status: DC | PRN

## 2020-09-27 MED ORDER — OXYCODONE-ACETAMINOPHEN 5-325 MG PO TABS: 2.0000 | ORAL_TABLET | ORAL | Status: DC | PRN

## 2020-09-27 MED ORDER — DIPHENHYDRAMINE HCL 50 MG/ML IJ SOLN: 12.5000 mg | INTRAMUSCULAR | Status: DC | PRN

## 2020-09-27 MED ORDER — TERBUTALINE SULFATE 1 MG/ML IJ SOLN: 0.2500 mg | Freq: Once | INTRAMUSCULAR | Status: DC | PRN

## 2020-09-27 MED ORDER — OXYCODONE-ACETAMINOPHEN 5-325 MG PO TABS: 1.0000 | ORAL_TABLET | ORAL | Status: DC | PRN

## 2020-09-27 MED ORDER — OXYTOCIN BOLUS FROM INFUSION
333.0000 mL | Freq: Once | INTRAVENOUS | Status: AC
  Administered 2020-09-27: 333 mL via INTRAVENOUS

## 2020-09-27 MED ORDER — EPHEDRINE 5 MG/ML INJ: 10.0000 mg | INTRAVENOUS | Status: DC | PRN

## 2020-09-27 MED ORDER — FENTANYL-BUPIVACAINE-NACL 0.5-0.125-0.9 MG/250ML-% EP SOLN
EPIDURAL | Status: DC | PRN
  Administered 2020-09-27: 12 mL/h via EPIDURAL

## 2020-09-27 MED ORDER — FENTANYL-BUPIVACAINE-NACL 0.5-0.125-0.9 MG/250ML-% EP SOLN
EPIDURAL | Status: AC
  Filled 2020-09-27: qty 250

## 2020-09-27 MED ORDER — LACTATED RINGERS IV SOLN: INTRAVENOUS | Status: DC

## 2020-09-27 MED ORDER — ACETAMINOPHEN 325 MG PO TABS: 650.0000 mg | ORAL_TABLET | ORAL | Status: DC | PRN

## 2020-09-27 NOTE — H&P (Signed)
Dorothy Fernandez is a 35 y.o. female presenting for IOL. +FM, denies VB, LOF, CTX. Known IUGR 9th%tile as of 36wks, h/o IUGR in G1. BPP/dopplers today showed normal dopplers however points lost for breathing and NRNST, total 6/10. Decision made to move IOL from 4/17 to this afternoon given today's antenatal testing.   PNC c/b the following  1) IUGR - @ 36 weeks, EFW 9th%tile, Dopplres WNL weekly 2) GAD - on PO buspar; h/o pp depression improved by Zoloft, will restart pp 3) Paroxysmal SVT - on PO propranalol 4) h/o uterine fibroids - degeneration in G1, LUS 4.5cm at workup 5) H/o VAVD - FHR decels  GBS neg, baby girl  OB History    Gravida  3   Para  1   Term  1   Preterm      AB  1   Living  1     SAB      IAB      Ectopic  1   Multiple  0   Live Births  1        Obstetric Comments  Treated with methotrexate       Past Medical History:  Diagnosis Date  . Anxiety   . Dysrhythmia    propanolol for SVT  . Fibroid   . Nephrolithiasis   . Tachycardia    Past Surgical History:  Procedure Laterality Date  . TONSILLECTOMY     Family History: family history includes Cancer in her maternal grandmother; Diabetes in her maternal grandfather; Hyperlipidemia in her mother. Social History:  reports that she has never smoked. She has never used smokeless tobacco. She reports previous alcohol use. She reports that she does not use drugs.     Maternal Diabetes: No1hr 131 Genetic Screening: Normal Maternal Ultrasounds/Referrals: IUGR Fetal Ultrasounds or other Referrals:  None Maternal Substance Abuse:  No Significant Maternal Medications:  Meds include: Other: Buspar Significant Maternal Lab Results:  Group B Strep negative Other Comments:  None  Review of Systems  Constitutional: Negative for chills and fever.  Respiratory: Negative for shortness of breath.   Cardiovascular: Negative for chest pain, palpitations and leg swelling.  Gastrointestinal: Negative for  abdominal pain and vomiting.  Neurological: Negative for dizziness, weakness and headaches.  Psychiatric/Behavioral: Negative for suicidal ideas.   Maternal Medical History:  Contractions: Frequency: irregular.   Perceived severity is mild.    Fetal activity: Perceived fetal activity is normal.    Prenatal complications: IUGR.   No bleeding, HIV, PIH, placental abnormality, pre-eclampsia or preterm labor.   Prenatal Complications - Diabetes: none.      Last menstrual period 10/21/2019, not currently breastfeeding. Exam Physical Exam Constitutional:      General: She is not in acute distress.    Appearance: She is well-developed.  HENT:     Head: Normocephalic and atraumatic.  Eyes:     Pupils: Pupils are equal, round, and reactive to light.  Cardiovascular:     Rate and Rhythm: Normal rate and regular rhythm.     Heart sounds: No murmur heard. No gallop.   Abdominal:     Tenderness: There is no abdominal tenderness. There is no guarding or rebound.  Genitourinary:    Vagina: Normal.  Musculoskeletal:        General: Normal range of motion.     Cervical back: Normal range of motion and neck supple.  Skin:    General: Skin is warm and dry.  Neurological:     Mental  Status: She is alert and oriented to person, place, and time.     Prenatal labs: ABO, Rh: O/Positive/-- (09/28 0000) Antibody: Negative, Negative (09/28 0000) Rubella: Immune (09/28 0000) RPR: Nonreactive (09/28 0000)  HBsAg: Negative, Negative (09/28 0000)  HIV: Non-reactive, Non-Reactive (09/28 0000)  GBS:   neg  CE 2/70/-2 in office Baseline 145, no distinct accels, one variable decels, min to mod var, overall category 2 tracing  Assessment/Plan: This is a 35yo G3P1011 @ 36 1/7 by LMP c/w TVUS admitted for IOL for BPP 6/10 in setting of IUGR at term. Dopplers WNL. Progressed quickly after AROM in G1, desires epidural prior to AROM. Will initiate pitocin and titrate per protocol. Will monitor to see  if tolerates labor, pt aware of indications for possible PLTCS. Pt amenable.   [place] 09/27/2020, 4:49 PM

## 2020-09-27 NOTE — Anesthesia Preprocedure Evaluation (Addendum)
Anesthesia Evaluation  Patient identified by MRN, date of birth, ID band Patient awake    Reviewed: Allergy & Precautions, NPO status , Patient's Chart, lab work & pertinent test results  Airway Mallampati: II  TM Distance: >3 FB Neck ROM: Full    Dental no notable dental hx. (+) Teeth Intact, Dental Advisory Given   Pulmonary neg pulmonary ROS,    Pulmonary exam normal breath sounds clear to auscultation       Cardiovascular Exercise Tolerance: Good negative cardio ROS Normal cardiovascular exam+ dysrhythmias Supra Ventricular Tachycardia  Rhythm:Regular Rate:Normal     Neuro/Psych Anxiety    GI/Hepatic negative GI ROS, Neg liver ROS,   Endo/Other  negative endocrine ROS  Renal/GU      Musculoskeletal   Abdominal   Peds  Hematology negative hematology ROS (+) Lab Results      Component                Value               Date                      WBC                      10.0                09/27/2020                HGB                      13.4                09/27/2020                HCT                      38.7                09/27/2020                MCV                      89.0                09/27/2020                PLT                      144 (L)             09/27/2020              Anesthesia Other Findings All: See list  Reproductive/Obstetrics (+) Pregnancy                             Anesthesia Physical Anesthesia Plan  ASA: II  Anesthesia Plan: Epidural   Post-op Pain Management:    Induction:   PONV Risk Score and Plan:   Airway Management Planned:   Additional Equipment:   Intra-op Plan:   Post-operative Plan:   Informed Consent: I have reviewed the patients History and Physical, chart, labs and discussed the procedure including the risks, benefits and alternatives for the proposed anesthesia with the patient or authorized representative who has indicated  his/her understanding and acceptance.       Plan Discussed with:  Anesthesia Plan Comments: (37.1 wk G3P1 for LEA)        Anesthesia Quick Evaluation

## 2020-09-27 NOTE — Anesthesia Procedure Notes (Signed)
Epidural Patient location during procedure: OB Start time: 09/27/2020 6:16 PM End time: 09/27/2020 6:29 PM  Staffing Anesthesiologist: Barnet Glasgow, MD Performed: anesthesiologist   Preanesthetic Checklist Completed: patient identified, IV checked, site marked, risks and benefits discussed, surgical consent, monitors and equipment checked, pre-op evaluation and timeout performed  Epidural Patient position: sitting Prep: DuraPrep and site prepped and draped Patient monitoring: continuous pulse ox and blood pressure Approach: midline Location: L3-L4 Injection technique: LOR air  Needle:  Needle type: Tuohy  Needle gauge: 17 G Needle length: 9 cm and 9 Needle insertion depth: 7 cm Catheter type: closed end flexible Catheter size: 19 Gauge Catheter at skin depth: 12 cm Test dose: negative  Assessment Events: blood not aspirated, injection not painful, no injection resistance, no paresthesia and negative IV test  Additional Notes Patient identified. Risks/Benefits/Options discussed with patient including but not limited to bleeding, infection, nerve damage, paralysis, failed block, incomplete pain control, headache, blood pressure changes, nausea, vomiting, reactions to medication both or allergic, itching and postpartum back pain. Confirmed with bedside nurse the patient's most recent platelet count. Confirmed with patient that they are not currently taking any anticoagulation, have any bleeding history or any family history of bleeding disorders. Patient expressed understanding and wished to proceed. All questions were answered. Sterile technique was used throughout the entire procedure. Please see nursing notes for vital signs. Test dose was given through epidural needle and negative prior to continuing to dose epidural or start infusion. Warning signs of high block given to the patient including shortness of breath, tingling/numbness in hands, complete motor block, or any  concerning symptoms with instructions to call for help. Patient was given instructions on fall risk and not to get out of bed. All questions and concerns addressed with instructions to call with any issues. 1  Attempt (S) . Patient tolerated procedure well.

## 2020-09-27 NOTE — Progress Notes (Signed)
Labor Note  S: s/p epidural, more comfortable  O: BP 116/63   Pulse 98   Temp 98.2 F (36.8 C) (Oral)   Resp 17   Ht 5\' 3"  (1.6 m)   Wt 71.9 kg   LMP 10/21/2019   SpO2 98%   BMI 28.09 kg/m  CE: 4/70/-2, much softer and more snterior CLEAR AROM @ 1845 FHR: Baseline 145, +accels, -decels, min to modvariability TOCO q2-64m, pitocin at 3mU/min  A/P: This is a 35 y.o. G3P1011 at [redacted]w[redacted]d  admitted for IOL for IUGR w/ BPP 6/10 in office. GBS neg, baby girl FWB: cat 1 tracing MWB: s/p epidural Labor course: progressing through latent labor, continue to titrate pitocin s/p AROM  Anticipate SVD

## 2020-09-27 NOTE — Progress Notes (Signed)
Tracing now category 1 with +accels, no decels. TOCO q2-24m, pt feeling painful contractions. CBC returned plt 144. Desires epidural at this time, will call anesthesia

## 2020-09-28 LAB — CBC
HCT: 32.9 % — ABNORMAL LOW (ref 36.0–46.0)
Hemoglobin: 10.9 g/dL — ABNORMAL LOW (ref 12.0–15.0)
MCH: 30 pg (ref 26.0–34.0)
MCHC: 33.1 g/dL (ref 30.0–36.0)
MCV: 90.6 fL (ref 80.0–100.0)
Platelets: 128 10*3/uL — ABNORMAL LOW (ref 150–400)
RBC: 3.63 MIL/uL — ABNORMAL LOW (ref 3.87–5.11)
RDW: 13.6 % (ref 11.5–15.5)
WBC: 10.5 10*3/uL (ref 4.0–10.5)
nRBC: 0 % (ref 0.0–0.2)

## 2020-09-28 LAB — RPR: RPR Ser Ql: NONREACTIVE

## 2020-09-28 MED ORDER — PROPRANOLOL HCL ER 80 MG PO CP24
80.0000 mg | ORAL_CAPSULE | Freq: Every day | ORAL | Status: DC
  Administered 2020-09-28 (×2): 80 mg via ORAL
  Filled 2020-09-28 (×3): qty 1

## 2020-09-28 MED ORDER — IBUPROFEN 600 MG PO TABS
600.0000 mg | ORAL_TABLET | Freq: Four times a day (QID) | ORAL | Status: DC
  Administered 2020-09-28 – 2020-09-29 (×6): 600 mg via ORAL
  Filled 2020-09-28 (×6): qty 1

## 2020-09-28 MED ORDER — DIPHENHYDRAMINE HCL 25 MG PO CAPS: 25.0000 mg | ORAL_CAPSULE | Freq: Four times a day (QID) | ORAL | Status: DC | PRN

## 2020-09-28 MED ORDER — ONDANSETRON HCL 4 MG PO TABS: 4.0000 mg | ORAL_TABLET | ORAL | Status: DC | PRN

## 2020-09-28 MED ORDER — SIMETHICONE 80 MG PO CHEW: 80.0000 mg | CHEWABLE_TABLET | ORAL | Status: DC | PRN

## 2020-09-28 MED ORDER — ACETAMINOPHEN 325 MG PO TABS: 650.0000 mg | ORAL_TABLET | ORAL | Status: DC | PRN

## 2020-09-28 MED ORDER — TETANUS-DIPHTH-ACELL PERTUSSIS 5-2.5-18.5 LF-MCG/0.5 IM SUSY: 0.5000 mL | PREFILLED_SYRINGE | Freq: Once | INTRAMUSCULAR | Status: DC

## 2020-09-28 MED ORDER — BENZOCAINE-MENTHOL 20-0.5 % EX AERO
1.0000 "application " | INHALATION_SPRAY | CUTANEOUS | Status: DC | PRN
  Administered 2020-09-28: 1 via TOPICAL
  Filled 2020-09-28: qty 56

## 2020-09-28 MED ORDER — WITCH HAZEL-GLYCERIN EX PADS: 1.0000 "application " | MEDICATED_PAD | CUTANEOUS | Status: DC | PRN

## 2020-09-28 MED ORDER — SENNOSIDES-DOCUSATE SODIUM 8.6-50 MG PO TABS
2.0000 | ORAL_TABLET | Freq: Every day | ORAL | Status: DC
  Administered 2020-09-28 – 2020-09-29 (×2): 2 via ORAL
  Filled 2020-09-28 (×3): qty 2

## 2020-09-28 MED ORDER — COCONUT OIL OIL: 1.0000 "application " | TOPICAL_OIL | Status: DC | PRN

## 2020-09-28 MED ORDER — PRENATAL MULTIVITAMIN CH
1.0000 | ORAL_TABLET | Freq: Every day | ORAL | Status: DC
  Administered 2020-09-28 – 2020-09-29 (×2): 1 via ORAL
  Filled 2020-09-28 (×2): qty 1

## 2020-09-28 MED ORDER — DIBUCAINE (PERIANAL) 1 % EX OINT: 1.0000 "application " | TOPICAL_OINTMENT | CUTANEOUS | Status: DC | PRN

## 2020-09-28 MED ORDER — ONDANSETRON HCL 4 MG/2ML IJ SOLN
4.0000 mg | INTRAMUSCULAR | Status: DC | PRN
Start: 2020-09-27 — End: 2020-09-29

## 2020-09-28 MED ORDER — SERTRALINE HCL 50 MG PO TABS
50.0000 mg | ORAL_TABLET | Freq: Every day | ORAL | Status: DC
  Administered 2020-09-28: 50 mg via ORAL
  Filled 2020-09-28: qty 1

## 2020-09-28 MED ORDER — ZOLPIDEM TARTRATE 5 MG PO TABS: 5.0000 mg | ORAL_TABLET | Freq: Every evening | ORAL | Status: DC | PRN

## 2020-09-28 MED ORDER — BUSPIRONE HCL 5 MG PO TABS: 10.0000 mg | ORAL_TABLET | Freq: Two times a day (BID) | ORAL | Status: DC

## 2020-09-28 NOTE — Anesthesia Postprocedure Evaluation (Signed)
Anesthesia Post Note  Patient: Dorothy Fernandez  Procedure(s) Performed: AN AD HOC LABOR EPIDURAL     Patient location during evaluation: Mother Baby Anesthesia Type: Epidural Level of consciousness: awake and alert Pain management: pain level controlled Vital Signs Assessment: post-procedure vital signs reviewed and stable Respiratory status: spontaneous breathing, nonlabored ventilation and respiratory function stable Cardiovascular status: stable Postop Assessment: no headache, no backache and epidural receding Anesthetic complications: no   No complications documented.  Last Vitals:  Vitals:   09/28/20 0231 09/28/20 0612  BP: 127/78 117/73  Pulse: (!) 102 97  Resp: 18 18  Temp: 36.7 C 36.6 C  SpO2: 99% 98%    Last Pain:  Vitals:   09/28/20 0612  TempSrc: Oral  PainSc: 1    Pain Goal:                   Elyas Villamor

## 2020-09-28 NOTE — Progress Notes (Signed)
PPD #1 No problems Afeb, VSS Fundus firm, NT at U-1 Continue routine postpartum care 

## 2020-09-28 NOTE — Social Work (Signed)
CSW received consult for hx of Postpartum Depression.  CSW met with MOB to offer support and complete assessment.    CSW introduced self and role. CSW observed FOB Nikola bedside and MOB holding infant. MOB was receptive, engaged and pleasant with CSW. MOB declined to speak in private and stated FOB could remain present for assessment. CSW informed MOB of reason for consult ansd assessed current mood. MOB reported she is currently doing really well. MOB disclosed she experienced PPD following the birth of her son last year and was able to manage the symptoms with Zoloft 5m. MOB stated she discontinued the medication during pregnancy but plans to resume postpartum. MOB expressed the Zoloft is very helpful and made a huge difference. CSW asked MOB if she is also taking Buspar, MOB reported she no longer takes Buspar. MOB identified a strong support system, which consists of FOB, her parents and FOB in-laws. MOB denies any current SI or HI.  CSW provided education regarding the baby blues period versus PPD and provided resources. CSW provided the New Mom Checklist and encouraged MOB to self evaluate and contact a medical professional if symptoms are noted at any time.  CSW provided review of Sudden Infant Death Syndrome (SIDS) precautions. MOB reported she has all essentials for infant, including a bassinet. MOB has identified a pediatrician and denies any barriers to care. MOB declined having any additional needs or resources at this time.     CSW identifies no further need for intervention and no barriers to discharge at this time.  CDarra Lis LBonney LakeWork WEnterprise Productsand CMolson Coors [employer]([phone]

## 2020-09-29 ENCOUNTER — Encounter (HOSPITAL_COMMUNITY): Payer: Self-pay | Admitting: Obstetrics and Gynecology

## 2020-09-29 ENCOUNTER — Other Ambulatory Visit (HOSPITAL_COMMUNITY): Payer: Self-pay

## 2020-09-29 MED ORDER — IBUPROFEN 600 MG PO TABS
600.0000 mg | ORAL_TABLET | Freq: Four times a day (QID) | ORAL | 1 refills | Status: DC | PRN
  Filled 2020-09-29: qty 40, 10d supply, fill #0

## 2020-09-29 MED ORDER — SERTRALINE HCL 50 MG PO TABS
50.0000 mg | ORAL_TABLET | Freq: Every day | ORAL | 2 refills | Status: DC
  Filled 2020-09-29 – 2021-01-20 (×2): qty 90, 90d supply, fill #0
  Filled 2021-04-20: qty 90, 90d supply, fill #1
  Filled 2021-07-18: qty 90, 90d supply, fill #2

## 2020-09-29 NOTE — Discharge Instructions (Signed)
Call office with any concerns [phone] 

## 2020-09-29 NOTE — Progress Notes (Signed)
Post Partum Day 2 Subjective: no complaints, up ad lib, voiding, tolerating PO, + flatus and lochia mild. Pt denies HA, CP or SOB. Feels better than did after first delivery. Bonding well with abby. Ready for dishcarge to home today  Objective: Blood pressure 111/74, pulse 90, temperature 99 F (37.2 C), temperature source Oral, resp. rate 18, height 5\' 3"  (1.6 m), weight 71.9 kg, last menstrual period 10/21/2019, SpO2 98 %, not currently breastfeeding.  Physical Exam:  General: alert Lochia: appropriate Uterine Fundus: firm Incision: n/a DVT Evaluation: No evidence of DVT seen on physical exam. Negative Homan's sign.  Recent Labs    09/27/20 1659 09/28/20 0500  HGB 13.4 10.9*  HCT 38.7 32.9*    Assessment/Plan: Discharge home  Unsure contraception   LOS: 2 days   [place] 09/29/2020, 1:49 PM

## 2020-09-29 NOTE — Discharge Summary (Signed)
Postpartum Discharge Summary  Date of Service updated      Patient Name: Dorothy Fernandez DOB: January 14, 1986 MRN: [id number]  Date of admission: 09/27/2020 Delivery date:09/27/2020  Delivering provider: Deliah Boston  Date of discharge: 09/29/2020  Admitting diagnosis: IUGR (intrauterine growth restriction) affecting care of mother [O36.5990] Intrauterine pregnancy: [redacted]w[redacted]d     Secondary diagnosis:  Active Problems:   IUGR (intrauterine growth restriction) affecting care of mother  Additional problems: none    Discharge diagnosis: Term Pregnancy Delivered                                              Post partum procedures:n/a Augmentation: AROM and Pitocin Complications: None  Hospital course: Induction of Labor With Vaginal Delivery   35 y.o. yo G3P1011 at [redacted]w[redacted]d was admitted to the hospital 09/27/2020 for induction of labor.  Indication for induction: Favorable cervix at term.  Patient had an uncomplicated labor course as follows: Membrane Rupture Time/Date: 6:43 PM ,09/27/2020   Delivery Method:Vaginal, Spontaneous  Episiotomy: None  Lacerations:  2nd degree  Details of delivery can be found in separate delivery note.  Patient had a routine postpartum course. Patient is discharged home 09/29/20.  Newborn Data: Birth date:09/27/2020  Birth time:10:28 PM  Gender:Female  Living status:Living  Apgars:8 ,9  Weight:2529 g   Magnesium Sulfate received: No BMZ received: No Rhophylac:N/A  Physical exam  Vitals:   09/28/20 0930 09/28/20 1600 09/28/20 2128 09/29/20 0532  BP: 118/75 115/78 110/75 111/74  Pulse: 89 73 91 90  Resp: 20 18 18 18   Temp: 98.1 F (36.7 C) 98.1 F (36.7 C) 98.3 F (36.8 C) 99 F (37.2 C)  TempSrc: Oral Oral Oral Oral  SpO2: 98% 98% 98% 98%  Weight:      Height:       General: alert, cooperative and no distress Lochia: appropriate Uterine Fundus: firm Incision: N/A DVT Evaluation: No evidence of DVT seen on physical exam. Labs: Lab Results   Component Value Date   WBC 10.5 09/28/2020   HGB 10.9 (L) 09/28/2020   HCT 32.9 (L) 09/28/2020   MCV 90.6 09/28/2020   PLT 128 (L) 09/28/2020   CMP Latest Ref Rng & Units 11/03/2019  Glucose 70 - 99 mg/dL 109(H)  BUN 6 - 23 mg/dL 6  Creatinine 0.40 - 1.20 mg/dL 0.68  Sodium 135 - 145 mEq/L 138  Potassium 3.5 - 5.1 mEq/L 4.2  Chloride 96 - 112 mEq/L 106  CO2 19 - 32 mEq/L 25  Calcium 8.4 - 10.5 mg/dL 9.3  Total Protein 6.0 - 8.3 g/dL 6.6  Total Bilirubin 0.2 - 1.2 mg/dL 0.6  Alkaline Phos 39 - 117 U/L 67  AST 0 - 37 U/L 15  ALT 0 - 35 U/L 11   Edinburgh Score: Edinburgh Postnatal Depression Scale Screening Tool 09/28/2020  I have been able to laugh and see the funny side of things. 0  I have looked forward with enjoyment to things. 0  I have blamed myself unnecessarily when things went wrong. 1  I have been anxious or worried for no good reason. 1  I have felt scared or panicky for no good reason. 0  Things have been getting on top of me. 0  I have been so unhappy that I have had difficulty sleeping. 0  I have felt sad or miserable. 0  I have been so unhappy that I have been crying. 0  The thought of harming myself has occurred to me. 0  Edinburgh Postnatal Depression Scale Total 2      After visit meds:     Discharge home in stable condition Infant Feeding: Breast Infant Disposition:home with mother Discharge instruction: per After Visit Summary and Postpartum booklet. Activity: Advance as tolerated. Pelvic rest for 6 weeks.  Diet: routine diet Anticipated Birth Control: Unsure Postpartum Appointment:6 weeks Additional Postpartum F/U: n/a Future Appointments: Future Appointments  Date Time Provider Shannon  10/01/2020  9:45 AM [facility] None   Follow up Visit:  Follow-up Information    Shivaji, Melida Quitter, MD. Schedule an appointment as soon as possible for a visit in 6 week(s).   Specialty: Obstetrics and Gynecology Why: For postpartum  visit Contact information: [place] [address] [phone]        Harsh, Pierce, MD .   Specialty: Internal Medicine                  09/29/2020 Isaiah Serge, DO

## 2020-09-30 LAB — SURGICAL PATHOLOGY

## 2020-10-01 ENCOUNTER — Other Ambulatory Visit (HOSPITAL_COMMUNITY): Admission: RE | Admit: 2020-10-01 | Payer: 59 | Source: Ambulatory Visit

## 2020-10-03 ENCOUNTER — Inpatient Hospital Stay (HOSPITAL_COMMUNITY): Payer: 59

## 2020-10-03 ENCOUNTER — Inpatient Hospital Stay (HOSPITAL_COMMUNITY): Admission: AD | Admit: 2020-10-03 | Payer: 59 | Source: Home / Self Care | Admitting: Obstetrics and Gynecology

## 2020-10-05 ENCOUNTER — Other Ambulatory Visit (HOSPITAL_COMMUNITY): Payer: Self-pay

## 2020-11-08 DIAGNOSIS — H5213 Myopia, bilateral: Secondary | ICD-10-CM | POA: Diagnosis not present

## 2020-11-11 DIAGNOSIS — Z1389 Encounter for screening for other disorder: Secondary | ICD-10-CM | POA: Diagnosis not present

## 2020-11-11 DIAGNOSIS — Z3009 Encounter for other general counseling and advice on contraception: Secondary | ICD-10-CM | POA: Diagnosis not present

## 2020-12-21 ENCOUNTER — Other Ambulatory Visit: Payer: Self-pay

## 2020-12-21 ENCOUNTER — Encounter: Payer: Self-pay | Admitting: Family

## 2020-12-21 ENCOUNTER — Ambulatory Visit (INDEPENDENT_AMBULATORY_CARE_PROVIDER_SITE_OTHER): Payer: 59 | Admitting: Family

## 2020-12-21 VITALS — BP 110/74 | HR 84 | Temp 99.1°F | Wt 139.4 lb

## 2020-12-21 DIAGNOSIS — Z0000 Encounter for general adult medical examination without abnormal findings: Secondary | ICD-10-CM | POA: Diagnosis not present

## 2020-12-21 NOTE — Patient Instructions (Signed)
 Health Maintenance, Female Adopting a healthy lifestyle and getting preventive care are important in promoting health and wellness. Ask your health care provider about: The right schedule for you to have regular tests and exams. Things you can do on your own to prevent diseases and keep yourself healthy. What should I know about diet, weight, and exercise? Eat a healthy diet  Eat a diet that includes plenty of vegetables, fruits, low-fat dairy products, and lean protein. Do not eat a lot of foods that are high in solid fats, added sugars, or sodium.  Maintain a healthy weight Body mass index (BMI) is used to identify weight problems. It estimates body fat based on height and weight. Your health care provider can help determineyour BMI and help you achieve or maintain a healthy weight. Get regular exercise Get regular exercise. This is one of the most important things you can do for your health. Most adults should: Exercise for at least 150 minutes each week. The exercise should increase your heart rate and make you sweat (moderate-intensity exercise). Do strengthening exercises at least twice a week. This is in addition to the moderate-intensity exercise. Spend less time sitting. Even light physical activity can be beneficial. Watch cholesterol and blood lipids Have your blood tested for lipids and cholesterol at 35 years of age, then havethis test every 5 years. Have your cholesterol levels checked more often if: Your lipid or cholesterol levels are high. You are older than 35 years of age. You are at high risk for heart disease. What should I know about cancer screening? Depending on your health history and family history, you may need to have cancer screening at various ages. This may include screening for: Breast cancer. Cervical cancer. Colorectal cancer. Skin cancer. Lung cancer. What should I know about heart disease, diabetes, and high blood pressure? Blood pressure and  heart disease High blood pressure causes heart disease and increases the risk of stroke. This is more likely to develop in people who have high blood pressure readings, are of African descent, or are overweight. Have your blood pressure checked: Every 3-5 years if you are 18-39 years of age. Every year if you are 40 years old or older. Diabetes Have regular diabetes screenings. This checks your fasting blood sugar level. Have the screening done: Once every three years after age 40 if you are at a normal weight and have a low risk for diabetes. More often and at a younger age if you are overweight or have a high risk for diabetes. What should I know about preventing infection? Hepatitis B If you have a higher risk for hepatitis B, you should be screened for this virus. Talk with your health care provider to find out if you are at risk forhepatitis B infection. Hepatitis C Testing is recommended for: Everyone born from 1945 through 1965. Anyone with known risk factors for hepatitis C. Sexually transmitted infections (STIs) Get screened for STIs, including gonorrhea and chlamydia, if: You are sexually active and are younger than 35 years of age. You are older than 35 years of age and your health care provider tells you that you are at risk for this type of infection. Your sexual activity has changed since you were last screened, and you are at increased risk for chlamydia or gonorrhea. Ask your health care provider if you are at risk. Ask your health care provider about whether you are at high risk for HIV. Your health care provider may recommend a prescription medicine to   help prevent HIV infection. If you choose to take medicine to prevent HIV, you should first get tested for HIV. You should then be tested every 3 months for as long as you are taking the medicine. Pregnancy If you are about to stop having your period (premenopausal) and you may become pregnant, seek counseling before you get  pregnant. Take 400 to 800 micrograms (mcg) of folic acid every day if you become pregnant. Ask for birth control (contraception) if you want to prevent pregnancy. Osteoporosis and menopause Osteoporosis is a disease in which the bones lose minerals and strength with aging. This can result in bone fractures. If you are 65 years old or older, or if you are at risk for osteoporosis and fractures, ask your health care provider if you should: Be screened for bone loss. Take a calcium or vitamin D supplement to lower your risk of fractures. Be given hormone replacement therapy (HRT) to treat symptoms of menopause. Follow these instructions at home: Lifestyle Do not use any products that contain nicotine or tobacco, such as cigarettes, e-cigarettes, and chewing tobacco. If you need help quitting, ask your health care provider. Do not use street drugs. Do not share needles. Ask your health care provider for help if you need support or information about quitting drugs. Alcohol use Do not drink alcohol if: Your health care provider tells you not to drink. You are pregnant, may be pregnant, or are planning to become pregnant. If you drink alcohol: Limit how much you use to 0-1 drink a day. Limit intake if you are breastfeeding. Be aware of how much alcohol is in your drink. In the [place] one drink equals one 12 oz bottle of beer (355 mL), one 5 oz glass of wine (148 mL), or one 1 oz glass of hard liquor (44 mL). General instructions Schedule regular health, dental, and eye exams. Stay current with your vaccines. Tell your health care provider if: You often feel depressed. You have ever been abused or do not feel safe at home. Summary Adopting a healthy lifestyle and getting preventive care are important in promoting health and wellness. Follow your health care provider's instructions about healthy diet, exercising, and getting tested or screened for diseases. Follow your health care provider's  instructions on monitoring your cholesterol and blood pressure. This information is not intended to replace advice given to you by your health care provider. Make sure you discuss any questions you have with your healthcare provider. Document Revised: 05/29/2018 Document Reviewed: 05/29/2018 Elsevier Patient Education  2022 Elsevier Inc.     Why follow it? Research shows. Those who follow the Mediterranean diet have a reduced risk of heart disease  The diet is associated with a reduced incidence of Parkinson's and Alzheimer's diseases People following the diet may have longer life expectancies and lower rates of chronic diseases  The Dietary Guidelines for Americans recommends the Mediterranean diet as an eating plan to promote health and prevent disease  What Is the Mediterranean Diet?  Healthy eating plan based on typical foods and recipes of Mediterranean-style cooking The diet is primarily a plant based diet; these foods should make up a majority of meals   Starches - Plant based foods should make up a majority of meals - They are an important sources of vitamins, minerals, energy, antioxidants, and fiber - Choose whole grains, foods high in fiber and minimally processed items  - Typical grain sources include wheat, oats, barley, corn, brown rice, bulgar, farro, millet, polenta, couscous  -   Various types of beans include chickpeas, lentils, fava beans, black beans, white beans   Fruits  Veggies - Large quantities of antioxidant rich fruits & veggies; 6 or more servings  - Vegetables can be eaten raw or lightly drizzled with oil and cooked  - Vegetables common to the traditional Mediterranean Diet include: artichokes, arugula, beets, broccoli, brussel sprouts, cabbage, carrots, celery, collard greens, cucumbers, eggplant, kale, leeks, lemons, lettuce, mushrooms, okra, onions, peas, peppers, potatoes, pumpkin, radishes, rutabaga, shallots, spinach, sweet potatoes, turnips, zucchini - Fruits  common to the Mediterranean Diet include: apples, apricots, avocados, cherries, clementines, dates, figs, grapefruits, grapes, melons, nectarines, oranges, peaches, pears, pomegranates, strawberries, tangerines  Fats - Replace butter and margarine with healthy oils, such as olive oil, canola oil, and tahini  - Limit nuts to no more than a handful a day  - Nuts include walnuts, almonds, pecans, pistachios, pine nuts  - Limit or avoid candied, honey roasted or heavily salted nuts - Olives are central to the Mediterranean diet - can be eaten whole or used in a variety of dishes   Meats Protein - Limiting red meat: no more than a few times a month - When eating red meat: choose lean cuts and keep the portion to the size of deck of cards - Eggs: approx. 0 to 4 times a week  - Fish and lean poultry: at least 2 a week  - Healthy protein sources include, chicken, turkey, lean beef, lamb - Increase intake of seafood such as tuna, salmon, trout, mackerel, shrimp, scallops - Avoid or limit high fat processed meats such as sausage and bacon  Dairy - Include moderate amounts of low fat dairy products  - Focus on healthy dairy such as fat free yogurt, skim milk, low or reduced fat cheese - Limit dairy products higher in fat such as whole or 2% milk, cheese, ice cream  Alcohol - Moderate amounts of red wine is ok  - No more than 5 oz daily for women (all ages) and men older than age 65  - No more than 10 oz of wine daily for men younger than 65  Other - Limit sweets and other desserts  - Use herbs and spices instead of salt to flavor foods  - Herbs and spices common to the traditional Mediterranean Diet include: basil, bay leaves, chives, cloves, cumin, fennel, garlic, lavender, marjoram, mint, oregano, parsley, pepper, rosemary, sage, savory, sumac, tarragon, thyme   It's not just a diet, it's a lifestyle:  The Mediterranean diet includes lifestyle factors typical of those in the region  Foods, drinks and  meals are best eaten with others and savored Daily physical activity is important for overall good health This could be strenuous exercise like running and aerobics This could also be more leisurely activities such as walking, housework, yard-work, or taking the stairs Moderation is the key; a balanced and healthy diet accommodates most foods and drinks Consider portion sizes and frequency of consumption of certain foods   Meal Ideas & Options:  Breakfast:  Whole wheat toast or whole wheat English muffins with peanut butter & hard boiled egg Steel cut oats topped with apples & cinnamon and skim milk  Fresh fruit: banana, strawberries, melon, berries, peaches  Smoothies: strawberries, bananas, greek yogurt, peanut butter Low fat greek yogurt with blueberries and granola  Egg white omelet with spinach and mushrooms Breakfast couscous: whole wheat couscous, apricots, skim milk, cranberries  Sandwiches:  Hummus and grilled vegetables (peppers, zucchini, squash) on whole wheat   bread   Grilled chicken on whole wheat pita with lettuce, tomatoes, cucumbers or tzatziki  Tuna salad on whole wheat bread: tuna salad made with greek yogurt, olives, red peppers, capers, green onions Garlic rosemary lamb pita: lamb sauted with garlic, rosemary, salt & pepper; add lettuce, cucumber, greek yogurt to pita - flavor with lemon juice and black pepper  Seafood:  Mediterranean grilled salmon, seasoned with garlic, basil, parsley, lemon juice and black pepper Shrimp, lemon, and spinach whole-grain pasta salad made with low fat greek yogurt  Seared scallops with lemon orzo  Seared tuna steaks seasoned salt, pepper, coriander topped with tomato mixture of olives, tomatoes, olive oil, minced garlic, parsley, green onions and cappers  Meats:  Herbed greek chicken salad with kalamata olives, cucumber, feta  Red bell peppers stuffed with spinach, bulgur, lean ground beef (or lentils) & topped with feta   Kebabs:  skewers of chicken, tomatoes, onions, zucchini, squash  Turkey burgers: made with red onions, mint, dill, lemon juice, feta cheese topped with roasted red peppers Vegetarian Cucumber salad: cucumbers, artichoke hearts, celery, red onion, feta cheese, tossed in olive oil & lemon juice  Hummus and whole grain pita points with a greek salad (lettuce, tomato, feta, olives, cucumbers, red onion) Lentil soup with celery, carrots made with vegetable broth, garlic, salt and pepper  Tabouli salad: parsley, bulgur, mint, scallions, cucumbers, tomato, radishes, lemon juice, olive oil, salt and pepper.      

## 2020-12-21 NOTE — Progress Notes (Signed)
Established Patient Office Visit  Subjective:  Patient ID: Dorothy Fernandez, female    DOB: [birth year]  Age: 35 y.o. MRN: [id number]  CC:  Chief Complaint  Patient presents with  . Annual Exam    HPI Dorothy Fernandez presents for 35 year old female presents for her annual wellness exam.  She is 12 weeks postpartum.  She now has a boy and a girl.  Baby is a formula fed without any difficulty obtaining formula.  Is not able to exercise much at this point but plans to start.  Reports that her mood has been good.  She takes Zoloft and is stable.  Plans to have another child within the next couple years.  No concerns today. Past Medical History:  Diagnosis Date  . Anxiety   . Dysrhythmia    propanolol for SVT  . Fibroid   . Nephrolithiasis   . Tachycardia     Past Surgical History:  Procedure Laterality Date  . TONSILLECTOMY      Family History  Problem Relation Age of Onset  . Hyperlipidemia Mother   . Cancer Maternal Grandmother        Cervical  . Diabetes Maternal Grandfather   . Colon cancer Neg Hx   . Breast cancer Neg Hx     Social History   Socioeconomic History  . Marital status: Married    Spouse name: Merrilee Seashore  . Number of children: 0  . Years of education: Not on file  . Highest education level: Doctorate  Occupational History  . Occupation: Pharmacist  Tobacco Use  . Smoking status: Never  . Smokeless tobacco: Never  Vaping Use  . Vaping Use: Never used  Substance and Sexual Activity  . Alcohol use: Not Currently    Comment: 1 X a year  . Drug use: No  . Sexual activity: Yes    Partners: Male    Birth control/protection: None  Other Topics Concern  . Not on file  Social History Narrative   Pharmacist   Married   16 months -- son   Social Determinants of Health   Financial Resource Strain: Not on file  Food Insecurity: Not on file  Transportation Needs: Not on file  Physical Activity: Not on file  Stress: Not on file  Social Connections: Not on  file  Intimate Partner Violence: Not on file    Outpatient Medications Prior to Visit  Medication Sig Dispense Refill  . nitrofurantoin, macrocrystal-monohydrate, (MACROBID) 100 MG capsule Take 100 mg by mouth as needed.    . Prenatal Vit-Fe Fumarate-FA (PRENATAL MULTIVITAMIN) TABS tablet Take 1 tablet by mouth daily at 12 noon.    . propranolol ER (INDERAL LA) 80 MG 24 hr capsule TAKE 1 CAPSULE BY MOUTH ONCE DAILY (Patient taking differently: Take 80 mg by mouth daily.) 90 capsule 0  . sertraline (ZOLOFT) 50 MG tablet Take 1 tablet (50 mg total) by mouth at bedtime. 90 tablet 2  . triamcinolone cream (KENALOG) 0.1 % Apply 1 application topically daily as needed.    Marland Kitchen ibuprofen (ADVIL) 600 MG tablet Take 1 tablet (600 mg total) by mouth every 6 (six) hours as needed for cramping. 40 tablet 1  . sertraline (ZOLOFT) 50 MG tablet Take 1 tablet (50 mg total) by at bedtime. (Patient not taking: Reported on 09/28/2020) 90 tablet 1   No facility-administered medications prior to visit.    Allergies  Allergen Reactions  . Cephalosporins Rash  . Erythromycin Rash  . Penicillins Rash  .  Sulfa Antibiotics Rash  . Ceclor [Cefaclor] Hives  . Suprax [Cefixime] Hives  . Septra [Sulfamethoxazole-Trimethoprim] Rash    ROS Review of Systems  Constitutional: Negative.   HENT: Negative.    Eyes: Negative.   Respiratory: Negative.    Cardiovascular: Negative.   Gastrointestinal: Negative.   Endocrine: Negative.   Genitourinary: Negative.   Musculoskeletal: Negative.   Skin: Negative.   Allergic/Immunologic: Negative.   Neurological: Negative.   Hematological: Negative.   Psychiatric/Behavioral: Negative.       Objective:    Physical Exam Vitals and nursing note reviewed.  Constitutional:      Appearance: Normal appearance. She is normal weight.  HENT:     Head: Normocephalic and atraumatic.     Right Ear: Tympanic membrane and ear canal normal.     Left Ear: Tympanic membrane and ear  canal normal.     Nose: Nose normal.     Mouth/Throat:     Mouth: Mucous membranes are moist.  Eyes:     Extraocular Movements: Extraocular movements intact.     Pupils: Pupils are equal, round, and reactive to light.  Cardiovascular:     Rate and Rhythm: Normal rate and regular rhythm.  Pulmonary:     Effort: Pulmonary effort is normal.  Abdominal:     General: Abdomen is flat. Bowel sounds are normal.     Palpations: Abdomen is soft.  Musculoskeletal:        General: Normal range of motion.     Cervical back: Normal range of motion and neck supple.  Skin:    General: Skin is warm and dry.  Neurological:     General: No focal deficit present.     Mental Status: She is alert and oriented to person, place, and time.  Psychiatric:        Mood and Affect: Mood normal.        Behavior: Behavior normal.   BP 110/74   Pulse 84   Temp 99.1 F (37.3 C) (Temporal)   Wt 139 lb 6.4 oz (63.2 kg)   SpO2 99%   Breastfeeding No   BMI 24.69 kg/m  Wt Readings from Last 3 Encounters:  12/21/20 139 lb 6.4 oz (63.2 kg)  09/27/20 158 lb 9 oz (71.9 kg)  11/03/19 130 lb 8 oz (59.2 kg)     Health Maintenance Due  Topic Date Due  . Hepatitis C Screening  Never done  . COVID-19 Vaccine (3 - Booster for Moderna series) 02/26/2020    There are no preventive care reminders to display for this patient.  Lab Results  Component Value Date   TSH 0.77 11/03/2019   Lab Results  Component Value Date   WBC 10.5 09/28/2020   HGB 10.9 (L) 09/28/2020   HCT 32.9 (L) 09/28/2020   MCV 90.6 09/28/2020   PLT 128 (L) 09/28/2020   Lab Results  Component Value Date   NA 138 11/03/2019   K 4.2 11/03/2019   CO2 25 11/03/2019   GLUCOSE 109 (H) 11/03/2019   BUN 6 11/03/2019   CREATININE 0.68 11/03/2019   BILITOT 0.6 11/03/2019   ALKPHOS 67 11/03/2019   AST 15 11/03/2019   ALT 11 11/03/2019   PROT 6.6 11/03/2019   ALBUMIN 4.5 11/03/2019   CALCIUM 9.3 11/03/2019   ANIONGAP 8 12/21/2018   GFR  99.06 11/03/2019   Lab Results  Component Value Date   CHOL 163 11/03/2019   Lab Results  Component Value Date   HDL 55.40  11/03/2019   Lab Results  Component Value Date   LDLCALC 96 11/03/2019   Lab Results  Component Value Date   TRIG 59.0 11/03/2019   Lab Results  Component Value Date   CHOLHDL 3 11/03/2019   No results found for: HGBA1C    Assessment & Plan:   Problem List Items Addressed This Visit   None Visit Diagnoses     Routine general medical examination at a health care facility    -  Primary   Relevant Orders   CBC with Differential   Comprehensive metabolic panel   Lipid panel   TSH       No orders of the defined types were placed in this encounter.  Encouraged healthy diet, exercise, self breast exams.  Labs will be obtained today will notify patient pending results.    Follow-up: Return in 1 year (on 12/21/2021).    Kennyth Arnold, FNP

## 2020-12-22 ENCOUNTER — Encounter: Payer: Self-pay | Admitting: Physician Assistant

## 2020-12-22 LAB — COMPREHENSIVE METABOLIC PANEL
ALT: 19 U/L (ref 0–35)
AST: 17 U/L (ref 0–37)
Albumin: 4.7 g/dL (ref 3.5–5.2)
Alkaline Phosphatase: 68 U/L (ref 39–117)
BUN: 13 mg/dL (ref 6–23)
CO2: 23 mEq/L (ref 19–32)
Calcium: 9.9 mg/dL (ref 8.4–10.5)
Chloride: 105 mEq/L (ref 96–112)
Creatinine, Ser: 0.69 mg/dL (ref 0.40–1.20)
GFR: 112.68 mL/min (ref >60.00)
Glucose, Bld: 84 mg/dL (ref 70–99)
Potassium: 4.3 mEq/L (ref 3.5–5.1)
Sodium: 137 mEq/L (ref 135–145)
Total Bilirubin: 0.8 mg/dL (ref 0.2–1.2)
Total Protein: 7.3 g/dL (ref 6.0–8.3)

## 2020-12-22 LAB — CBC WITH DIFFERENTIAL/PLATELET
Basophils Absolute: 0.1 10*3/uL (ref 0.0–0.1)
Basophils Relative: 1 % (ref 0.0–3.0)
Eosinophils Absolute: 0.3 10*3/uL (ref 0.0–0.7)
Eosinophils Relative: 4.1 % (ref 0.0–5.0)
HCT: 40.1 % (ref 36.0–46.0)
Hemoglobin: 13.7 g/dL (ref 12.0–15.0)
Lymphocytes Relative: 39 % (ref 12.0–46.0)
Lymphs Abs: 3.2 10*3/uL (ref 0.7–4.0)
MCHC: 34.1 g/dL (ref 30.0–36.0)
MCV: 87.1 fl (ref 78.0–100.0)
Monocytes Absolute: 0.6 10*3/uL (ref 0.1–1.0)
Monocytes Relative: 7.7 % (ref 3.0–12.0)
Neutro Abs: 4 10*3/uL (ref 1.4–7.7)
Neutrophils Relative %: 48.2 % (ref 43.0–77.0)
Platelets: 222 10*3/uL (ref 150.0–400.0)
RBC: 4.61 Mil/uL (ref 3.87–5.11)
RDW: 14.2 % (ref 11.5–15.5)
WBC: 8.3 10*3/uL (ref 4.0–10.5)

## 2020-12-22 LAB — LIPID PANEL
Cholesterol: 167 mg/dL (ref 0–200)
HDL: 53.1 mg/dL (ref >39.00)
LDL Cholesterol: 88 mg/dL (ref 0–99)
NonHDL: 113.53
Total CHOL/HDL Ratio: 3
Triglycerides: 127 mg/dL (ref 0.0–149.0)
VLDL: 25.4 mg/dL (ref 0.0–40.0)

## 2020-12-22 LAB — TSH: TSH: 0.91 u[IU]/mL (ref 0.35–5.50)

## 2020-12-23 ENCOUNTER — Other Ambulatory Visit (HOSPITAL_COMMUNITY): Payer: Self-pay

## 2020-12-23 MED ORDER — NITROFURANTOIN MONOHYD MACRO 100 MG PO CAPS
100.0000 mg | ORAL_CAPSULE | ORAL | 0 refills | Status: AC | PRN
  Filled 2020-12-23: qty 90, 90d supply, fill #0

## 2020-12-23 MED ORDER — PROPRANOLOL HCL ER 80 MG PO CP24
ORAL_CAPSULE | Freq: Every day | ORAL | 1 refills | Status: DC
  Filled 2020-12-23: qty 90, 90d supply, fill #0
  Filled 2021-03-29: qty 90, 90d supply, fill #1

## 2021-01-20 ENCOUNTER — Other Ambulatory Visit (HOSPITAL_COMMUNITY): Payer: Self-pay

## 2021-03-29 ENCOUNTER — Other Ambulatory Visit (HOSPITAL_COMMUNITY): Payer: Self-pay

## 2021-04-21 ENCOUNTER — Other Ambulatory Visit (HOSPITAL_COMMUNITY): Payer: Self-pay

## 2021-05-02 ENCOUNTER — Other Ambulatory Visit: Payer: Self-pay

## 2021-05-02 ENCOUNTER — Ambulatory Visit: Payer: 59 | Admitting: Physician Assistant

## 2021-05-02 ENCOUNTER — Encounter: Payer: Self-pay | Admitting: Physician Assistant

## 2021-05-02 VITALS — BP 106/80 | HR 83 | Temp 98.1°F | Ht 63.0 in | Wt 141.0 lb

## 2021-05-02 DIAGNOSIS — M25551 Pain in right hip: Secondary | ICD-10-CM | POA: Diagnosis not present

## 2021-05-02 NOTE — Patient Instructions (Signed)
It was great to see you!  Please schedule an appointment with Lyndee Hensen here for PT on your way out.  An order for an xray has been put in for you. To get your xray, you can walk in at the [facility] location without a scheduled appointment.  The address is [address]. It is across the street from [place] is located in the basement.  Hours of operation are M-F 8:30am to 5:00pm. Please note that they are closed for lunch between 12:30 and 1:00pm.  Take care,  Inda Coke PA-C

## 2021-05-02 NOTE — Progress Notes (Signed)
Dorothy Fernandez is a 35 y.o. female here for hip pain.  History of Present Illness:   Chief Complaint  Patient presents with   Hip Pain    Pt has been having right hip pain off and on x several years. Pt states it has been worse the past week. Has been taking 800 mg of Ibuprofen at bedtime. She also has hx of herniated disc low back since age 59.    HPI  Right Hip Pain Dorothy presents with c/o intermittent right hip pain that has been onset for several years, worsening in the past week. Pt has stated she feels the pain initially in her right hip and occasionally it radiates the her right buttock. She has noticed that lying down does exacerbate the pain in her hip. Dorothy Fernandez has been taking 800 mg ibuprofen before bed which has provided her with minor relief. 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 Pt does have a hx of a herniated disc in her lower back since the age of 58. Denies recent injury/MVA.  Denies loss of bowel/bladder function.  Past Medical History:  Diagnosis Date   Anxiety    Dysrhythmia    propanolol for SVT   Fibroid    Nephrolithiasis    Tachycardia      Social History   Tobacco Use   Smoking status: Never   Smokeless tobacco: Never  Vaping Use   Vaping Use: Never used  Substance Use Topics   Alcohol use: Not Currently    Comment: 1 X a year   Drug use: No    Past Surgical History:  Procedure Laterality Date   TONSILLECTOMY      Family History  Problem Relation Age of Onset   Hyperlipidemia Mother    Cancer Maternal Grandmother        Cervical   Diabetes Maternal Grandfather    Colon cancer Neg Hx    Breast cancer Neg Hx     Allergies  Allergen Reactions   Cephalosporins Rash   Erythromycin Rash   Penicillins Rash   Sulfa Antibiotics Rash   Ceclor [Cefaclor] Hives   Suprax [Cefixime] Hives   Septra  [Sulfamethoxazole-Trimethoprim] Rash    Current Medications:   Current Outpatient Medications:    nitrofurantoin, macrocrystal-monohydrate, (MACROBID) 100 MG capsule, Take 1 capsule (100 mg total) by mouth as needed., Disp: 90 capsule, Rfl: 0   propranolol ER (INDERAL LA) 80 MG 24 hr capsule, TAKE 1 CAPSULE BY MOUTH ONCE DAILY, Disp: 90 capsule, Rfl: 1   sertraline (ZOLOFT) 50 MG tablet, Take 1 tablet (50 mg total) by mouth at bedtime., Disp: 90 tablet, Rfl: 2   triamcinolone cream (KENALOG) 0.1 %, Apply 1 application topically daily as needed., Disp: , Rfl:    Review of Systems:   ROS Negative unless otherwise specified per HPI.  Vitals:   Vitals:   05/02/21 1334  BP: 106/80  Pulse: 83  Temp: 98.1 F (36.7 C)  TempSrc: Temporal  SpO2: 97%  Weight: 141 lb (64 kg)  Height: 5\' 3"  (1.6 m)     Body mass index is 24.98 kg/m.  Physical Exam:   Physical Exam Vitals and nursing note reviewed.  Constitutional:      General: She is not in acute distress.    Appearance: She is well-developed. She is not ill-appearing or toxic-appearing.  Cardiovascular:  Rate and Rhythm: Normal rate and regular rhythm.     Pulses: Normal pulses.     Heart sounds: Normal heart sounds, S1 normal and S2 normal.  Pulmonary:     Effort: Pulmonary effort is normal.     Breath sounds: Normal breath sounds.  Musculoskeletal:     Cervical back: Normal.     Thoracic back: Normal.     Right hip: Bony tenderness present.     Left hip: Normal.     Comments: Slight decreased ROM 2/2 pain with flexion/extension, lateral side bends, or rotation. Reproducible tenderness with deep palpation to R SI joint area. No bony spinal tenderness.   TTP to R lateral hip greater trochanter area  Skin:    General: Skin is warm and dry.  Neurological:     Mental Status: She is alert.     GCS: GCS eye subscore is 4. GCS verbal subscore is 5. GCS motor subscore is 6.  Psychiatric:        Speech: Speech normal.         Behavior: Behavior normal. Behavior is cooperative.    Assessment and Plan:   Chronic Hip Pain Due to bony tenderness, lack of MOI and increasing symptoms will obtain xray for further evaluation Declines need for medication at this time PT referral placed for further evaluation Further work-up based on xray results    I,Havlyn C Ratchford,acting as a scribe for Inda Coke, PA.,have documented all relevant documentation on the behalf of Inda Coke, PA,as directed by  Inda Coke, PA while in the presence of Inda Coke, [place].   I, Inda Coke, [place], have reviewed all documentation for this visit. The documentation on 05/02/21 for the exam, diagnosis, procedures, and orders are all accurate and complete.   Inda Coke, PA-C

## 2021-05-06 ENCOUNTER — Other Ambulatory Visit: Payer: Self-pay

## 2021-05-06 ENCOUNTER — Ambulatory Visit (INDEPENDENT_AMBULATORY_CARE_PROVIDER_SITE_OTHER)
Admission: RE | Admit: 2021-05-06 | Discharge: 2021-05-06 | Disposition: A | Discharge status: Home / Self Care | Payer: 59 | Source: Ambulatory Visit | Attending: Physician Assistant | Admitting: Physician Assistant

## 2021-05-06 DIAGNOSIS — M25551 Pain in right hip: Secondary | ICD-10-CM

## 2021-06-28 ENCOUNTER — Other Ambulatory Visit (HOSPITAL_COMMUNITY): Payer: Self-pay

## 2021-06-28 ENCOUNTER — Other Ambulatory Visit: Payer: Self-pay | Admitting: Family Medicine

## 2021-06-28 MED ORDER — PROPRANOLOL HCL ER 80 MG PO CP24
ORAL_CAPSULE | Freq: Every day | ORAL | 1 refills | Status: DC
  Filled 2021-06-28: qty 90, 90d supply, fill #0

## 2021-06-30 ENCOUNTER — Other Ambulatory Visit (HOSPITAL_COMMUNITY): Payer: Self-pay

## 2021-07-05 ENCOUNTER — Other Ambulatory Visit (HOSPITAL_COMMUNITY): Payer: Self-pay

## 2021-07-18 ENCOUNTER — Other Ambulatory Visit (HOSPITAL_COMMUNITY): Payer: Self-pay

## 2021-09-13 ENCOUNTER — Other Ambulatory Visit (HOSPITAL_COMMUNITY): Payer: Self-pay

## 2021-09-13 ENCOUNTER — Encounter: Payer: Self-pay | Admitting: Physician Assistant

## 2021-09-13 ENCOUNTER — Ambulatory Visit (INDEPENDENT_AMBULATORY_CARE_PROVIDER_SITE_OTHER): Payer: No Typology Code available for payment source | Admitting: Physician Assistant

## 2021-09-13 VITALS — HR 85 | Temp 98.2°F | Ht 63.0 in | Wt 146.4 lb

## 2021-09-13 DIAGNOSIS — U099 Post covid-19 condition, unspecified: Secondary | ICD-10-CM | POA: Diagnosis not present

## 2021-09-13 DIAGNOSIS — F419 Anxiety disorder, unspecified: Secondary | ICD-10-CM

## 2021-09-13 DIAGNOSIS — E559 Vitamin D deficiency, unspecified: Secondary | ICD-10-CM | POA: Diagnosis not present

## 2021-09-13 DIAGNOSIS — I471 Supraventricular tachycardia: Secondary | ICD-10-CM

## 2021-09-13 DIAGNOSIS — R1013 Epigastric pain: Secondary | ICD-10-CM | POA: Insufficient documentation

## 2021-09-13 DIAGNOSIS — R41840 Attention and concentration deficit: Secondary | ICD-10-CM | POA: Diagnosis not present

## 2021-09-13 LAB — CBC WITH DIFFERENTIAL/PLATELET
Basophils Absolute: 0.1 10*3/uL (ref 0.0–0.1)
Basophils Relative: 1.1 % (ref 0.0–3.0)
Eosinophils Absolute: 0.3 10*3/uL (ref 0.0–0.7)
Eosinophils Relative: 4.6 % (ref 0.0–5.0)
HCT: 40.3 % (ref 36.0–46.0)
Hemoglobin: 13.6 g/dL (ref 12.0–15.0)
Lymphocytes Relative: 38.2 % (ref 12.0–46.0)
Lymphs Abs: 2.5 10*3/uL (ref 0.7–4.0)
MCHC: 33.6 g/dL (ref 30.0–36.0)
MCV: 87.3 fl (ref 78.0–100.0)
Monocytes Absolute: 0.6 10*3/uL (ref 0.1–1.0)
Monocytes Relative: 8.9 % (ref 3.0–12.0)
Neutro Abs: 3.1 10*3/uL (ref 1.4–7.7)
Neutrophils Relative %: 47.2 % (ref 43.0–77.0)
Platelets: 253 10*3/uL (ref 150.0–400.0)
RBC: 4.62 Mil/uL (ref 3.87–5.11)
RDW: 13.7 % (ref 11.5–15.5)
WBC: 6.6 10*3/uL (ref 4.0–10.5)

## 2021-09-13 LAB — COMPREHENSIVE METABOLIC PANEL
ALT: 13 U/L (ref 0–35)
AST: 14 U/L (ref 0–37)
Albumin: 4.7 g/dL (ref 3.5–5.2)
Alkaline Phosphatase: 64 U/L (ref 39–117)
BUN: 12 mg/dL (ref 6–23)
CO2: 24 mEq/L (ref 19–32)
Calcium: 9.7 mg/dL (ref 8.4–10.5)
Chloride: 105 mEq/L (ref 96–112)
Creatinine, Ser: 0.82 mg/dL (ref 0.40–1.20)
GFR: 92.4 mL/min (ref >60.00)
Glucose, Bld: 99 mg/dL (ref 70–99)
Potassium: 4.3 mEq/L (ref 3.5–5.1)
Sodium: 137 mEq/L (ref 135–145)
Total Bilirubin: 0.6 mg/dL (ref 0.2–1.2)
Total Protein: 7 g/dL (ref 6.0–8.3)

## 2021-09-13 LAB — POCT URINE PREGNANCY: Preg Test, Ur: NEGATIVE

## 2021-09-13 LAB — VITAMIN B12: Vitamin B-12: 490 pg/mL (ref 211–911)

## 2021-09-13 LAB — H. PYLORI ANTIBODY, IGG: H Pylori IgG: NEGATIVE

## 2021-09-13 LAB — TSH: TSH: 0.58 u[IU]/mL (ref 0.35–5.50)

## 2021-09-13 LAB — VITAMIN D 25 HYDROXY (VIT D DEFICIENCY, FRACTURES): VITD: 27.41 ng/mL — ABNORMAL LOW (ref 30.00–100.00)

## 2021-09-13 MED ORDER — PROPRANOLOL HCL ER 60 MG PO CP24
60.0000 mg | ORAL_CAPSULE | Freq: Every day | ORAL | 3 refills | Status: DC
  Filled 2021-09-13: qty 90, 90d supply, fill #0
  Filled 2021-12-11: qty 90, 90d supply, fill #1
  Filled 2022-03-22: qty 90, 90d supply, fill #2
  Filled 2022-06-21: qty 90, 90d supply, fill #3

## 2021-09-13 MED ORDER — BUPROPION HCL ER (XL) 150 MG PO TB24
150.0000 mg | ORAL_TABLET | Freq: Every day | ORAL | 1 refills | Status: DC
  Filled 2021-09-13: qty 90, 90d supply, fill #0

## 2021-09-13 MED ORDER — PANTOPRAZOLE SODIUM 40 MG PO TBEC
40.0000 mg | DELAYED_RELEASE_TABLET | Freq: Every day | ORAL | 1 refills | Status: DC
  Filled 2021-09-13: qty 90, 90d supply, fill #0
  Filled 2022-04-16: qty 90, 90d supply, fill #1

## 2021-09-13 NOTE — Progress Notes (Signed)
Dorothy Fernandez is a 36 y.o. female here for a follow up of a pre-existing problem. ? ?History of Present Illness:  ? ?Chief Complaint  ?Patient presents with  ? Fatigue  ?  Pt stated that she has severe fatigue and brain fog since she was Dx with COVID back mid December. Also dealing with Anxiety/depression and feels like the Zoloft is not working. Pt also experiencing some upper GI pain at times but will go away on its own.  ? ? ?HPI ? ?Anxiety/Depression ?Dorothy Fernandez is currently compliant with taking zoloft 50 mg daily with no adverse effects. Although she once found this medication to be beneficial, she is at a point where she thinks it's not working anymore. Currently expresses belief that all other ongoing issues could be resolved or improved with an adjustment in this medication. Denies SI/HI.  ? ?Post Covid Brain Fog ?Dorothy Fernandez presents with concerns of brain fog that seemingly began following her being sick with COVID in December of 2022. States this was her first time with COVID despite having two doses of the vaccination. Reports that she has noticed at work she is making more errors and not able to focus like she used to prior to her covid infection.  ? ?Fatigue ?In addition to above concern, pt has also been experiencing increased feelings of fatigue. 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 Despite this, she states she is sleeping well and is receiving about 7 hours of sleep, but is still experiencing the need to take a nap throughout the day. She does admit to having an increased intake of caffeine and believes this could be worsening this, but would still like a further workup. Denies abnormal menstrual cycle or recent travel.  ? ?Hx of SVT ?At this time pt is compliant with taking propanolol xl 80 mg daily with no adverse effects, but is interested in decreasing this in hopes that increased feelings of fatigue  will improve from this. Denies concerning sx.  ? ?Epigastric Pain ?Upon further discussion, pt reports she has been experiencing epigastric pain that radiates to her RUQ that has been onset for a couple of weeks. She describes the pain as an intermittent dull ache that could last anywhere form a couple of hours to a day. She has noticed the pain is always accompanied by nausea and sometimes light bouts of diarrhea. In an effort to manage her sx, she has tried taking pepcid, but this didn't provide much relief. Due to her not being able to find any relief or know triggers, she decided to have this further evaluated. Denies fever, chills, constipation, or vomiting.  ? ?Vitamin D Deficiency  ?Pt is currently not taking an otc supplement, but would still like to check this to see if she needs to start one. Denies concerning sx.  ? ?Past Medical History:  ?Diagnosis Date  ? Anxiety   ? Dysrhythmia   ? propanolol for SVT  ? Fibroid   ? Nephrolithiasis   ? Tachycardia   ? ?  ?Social History  ? ?Tobacco Use  ? Smoking status: Never  ? Smokeless tobacco: Never  ?Vaping Use  ? Vaping Use: Never used  ?Substance Use Topics  ? Alcohol use: Not Currently  ?  Comment: 1 X a year  ? Drug use: No  ? ? ?Past Surgical History:  ?Procedure Laterality Date  ?  TONSILLECTOMY    ? ? ?Family History  ?Problem Relation Age of Onset  ? Hyperlipidemia Mother   ? Cancer Maternal Grandmother   ?     Cervical  ? Diabetes Maternal Grandfather   ? Colon cancer Neg Hx   ? Breast cancer Neg Hx   ? ? ?Allergies  ?Allergen Reactions  ? Cephalosporins Rash  ? Erythromycin Rash  ? Penicillins Rash  ? Sulfa Antibiotics Rash  ? Ceclor [Cefaclor] Hives  ? Suprax [Cefixime] Hives  ? Septra [Sulfamethoxazole-Trimethoprim] Rash  ? ? ?Current Medications:  ? ?Current Outpatient Medications:  ?  buPROPion (WELLBUTRIN XL) 150 MG 24 hr tablet, Take 1 tablet (150 mg total) by mouth daily., Disp: 90 tablet, Rfl: 1 ?  nitrofurantoin, macrocrystal-monohydrate,  (MACROBID) 100 MG capsule, Take 1 capsule (100 mg total) by mouth as needed., Disp: 90 capsule, Rfl: 0 ?  pantoprazole (PROTONIX) 40 MG tablet, Take 1 tablet (40 mg total) by mouth daily., Disp: 90 tablet, Rfl: 1 ?  propranolol ER (INDERAL LA) 60 MG 24 hr capsule, Take 1 capsule (60 mg total) by mouth daily., Disp: 90 capsule, Rfl: 3 ?  sertraline (ZOLOFT) 50 MG tablet, Take 1 tablet (50 mg total) by mouth at bedtime., Disp: 90 tablet, Rfl: 2 ?  triamcinolone cream (KENALOG) 0.1 %, Apply 1 application topically daily as needed., Disp: , Rfl:   ? ?Review of Systems:  ? ?ROS ?Negative unless otherwise specified per HPI. ? ?Vitals:  ? ?Vitals:  ? 09/13/21 1001  ?Pulse: 85  ?Temp: 98.2 ?F (36.8 ?C)  ?SpO2: 99%  ?Weight: 146 lb 6.4 oz (66.4 kg)  ?Height: '5\' 3"'$  (1.6 m)  ?   ?Body mass index is 25.93 kg/m?. ? ?Physical Exam:  ? ?Physical Exam ?Vitals and nursing note reviewed.  ?Constitutional:   ?   General: She is not in acute distress. ?   Appearance: Normal appearance. She is well-developed. She is not ill-appearing or toxic-appearing.  ?HENT:  ?   Head: Normocephalic and atraumatic.  ?   Right Ear: Tympanic membrane, ear canal and external ear normal. Tympanic membrane is not erythematous, retracted or bulging.  ?   Left Ear: Tympanic membrane, ear canal and external ear normal. Tympanic membrane is not erythematous, retracted or bulging.  ?Eyes:  ?   General: Lids are normal.  ?   Conjunctiva/sclera: Conjunctivae normal.  ?   Pupils: Pupils are equal, round, and reactive to light.  ?Neck:  ?   Trachea: Trachea normal.  ?Cardiovascular:  ?   Rate and Rhythm: Normal rate and regular rhythm.  ?   Heart sounds: Normal heart sounds, S1 normal and S2 normal.  ?Pulmonary:  ?   Effort: Pulmonary effort is normal. No tachypnea or respiratory distress.  ?   Breath sounds: Normal breath sounds. No decreased breath sounds, wheezing, rhonchi or rales.  ?Abdominal:  ?   General: Abdomen is flat. Bowel sounds are normal.  ?    Palpations: Abdomen is soft.  ?   Tenderness: There is no abdominal tenderness. There is no right CVA tenderness, left CVA tenderness, guarding or rebound.  ?Musculoskeletal:     ?   General: Normal range of motion.  ?   Cervical back: Full passive range of motion without pain.  ?Lymphadenopathy:  ?   Cervical: No cervical adenopathy.  ?Skin: ?   General: Skin is warm and dry.  ?Neurological:  ?   Mental Status: She is alert.  ?   GCS: GCS  eye subscore is 4. GCS verbal subscore is 5. GCS motor subscore is 6.  ?   Cranial Nerves: No cranial nerve deficit.  ?   Sensory: No sensory deficit.  ?   Deep Tendon Reflexes: Reflexes are normal and symmetric.  ?Psychiatric:     ?   Speech: Speech normal.     ?   Behavior: Behavior normal. Behavior is cooperative.  ? ?Results for orders placed or performed in visit on 09/13/21  ?POCT urine pregnancy  ?Result Value Ref Range  ? Preg Test, Ur Negative Negative  ? ? ?Assessment and Plan:  ? ?COVID-19 long hauler manifesting chronic concentration deficit; Anxiety ?Update labs, will make recommendations accordingly ?Continue Zoloft 50 mg daily ?Start Wellbutrin XL 150 mg daily ?Decrease propanolol 60 mg daily ?Recommended patient to work on slowly decreasing caffeine intake  ?Follow up if new/worsening symptoms or concerns occur ? ?Epigastric pain ?No red flags ?UPT neg ?Check H-Pylori ?Start protonix 40 mg daily  ?Follow up if new/worsening symptoms or lack of improvement of sx occurs ? ?Vitamin D deficiency ?Update labs, will make recommendations accordingly  ? ?Paroxysmal SVT (supraventricular tachycardia) (HCC) ?Well controlled ?Decrease propanolol 60 mg daily given ongoing fatigue ?Follow-up prn ? ?I,Havlyn C Ratchford,acting as a scribe for [employer], PA.,have documented all relevant documentation on the behalf of Inda Coke, PA,as directed by  Inda Coke, PA while in the presence of Inda Coke, [place]. ? ?IInda Coke, PA, have reviewed all  documentation for this visit. The documentation on 09/13/21 for the exam, diagnosis, procedures, and orders are all accurate and complete. ? ?Inda Coke, PA-C ? ?

## 2021-09-13 NOTE — Patient Instructions (Signed)
It was great to see you! ? ?Decrease propranolol to 60 mg daily ?Continue zoloft 50 ?Add Wellbutrin 150 ?Add protonix 40 mg ? ?We will update blood work today ? ?Slowly work on caffeine reduction ? ?If any symptoms do not improve or get worse, let me know in one month or so. ? ?Take care, ? ?Inda Coke PA-C  ?

## 2021-10-05 ENCOUNTER — Encounter: Payer: Self-pay | Admitting: Physician Assistant

## 2021-10-06 ENCOUNTER — Other Ambulatory Visit (HOSPITAL_COMMUNITY): Payer: Self-pay

## 2021-10-06 ENCOUNTER — Other Ambulatory Visit: Payer: Self-pay | Admitting: Physician Assistant

## 2021-10-06 MED ORDER — FLUOXETINE HCL 20 MG PO CAPS
20.0000 mg | ORAL_CAPSULE | Freq: Every day | ORAL | 1 refills | Status: DC
  Filled 2021-10-06: qty 90, 90d supply, fill #0
  Filled 2022-01-01: qty 90, 90d supply, fill #1

## 2021-11-15 ENCOUNTER — Ambulatory Visit: Payer: No Typology Code available for payment source | Admitting: Physician Assistant

## 2021-11-15 ENCOUNTER — Encounter: Payer: Self-pay | Admitting: Physician Assistant

## 2021-11-15 ENCOUNTER — Ambulatory Visit (INDEPENDENT_AMBULATORY_CARE_PROVIDER_SITE_OTHER): Payer: No Typology Code available for payment source | Admitting: Physician Assistant

## 2021-11-15 VITALS — BP 120/80 | HR 88 | Temp 98.3°F | Ht 63.0 in | Wt 149.0 lb

## 2021-11-15 DIAGNOSIS — R1011 Right upper quadrant pain: Secondary | ICD-10-CM | POA: Diagnosis not present

## 2021-11-15 LAB — LIPASE: Lipase: 47 U/L (ref 11.0–59.0)

## 2021-11-15 NOTE — Patient Instructions (Signed)
It was great to see you!  You will be contacted about your ultrasound of your belly --- if you don't hear anything within the week, let me know.  If new/worsening symptoms, let us know  We will also check a lipase today  Take care,  Inda Coke PA-C

## 2021-11-15 NOTE — Progress Notes (Signed)
Dorothy Fernandez is a 36 y.o. female here for a follow up of a pre-existing problem.  History of Present Illness:   Chief Complaint  Patient presents with   Abdominal Pain    Pt c/o epigastric pain with nausea off and on x 4 months, was started on Protonix 40 mg daily. Not helping. She had an episode 2 weeks ago vomiting several times that day.    HPI  Epigastric Pain Patient complain of epigastric pain that has been present for the past 4 months. She was seen for this about 2 months ago. At that time, she was experiencing pain that radiates to her RUQ. She was prescribed Protonix 40 mg daily in that visit. She is tolerating her medication with no side effects. States this has not been helping. She continues to have epigastric pain accompanied with nausea which seems to be not improving.   Today, she notes she has been experiencing dull ache pain which last couple of hours to a day. She has been having this pain about 3-4 times a month. She is not sure if this is worse with eating. She has been having several parties at her home and mostly eating pizza and cake. She reports she has had 2 episodes of worsening pain which was accompanied by vomiting. Denies fever or chills. No reported rectal bleeding or hematemesis.    Past Medical History:  Diagnosis Date   Anxiety    Dysrhythmia    propanolol for SVT   Fibroid    Nephrolithiasis    Tachycardia      Social History   Tobacco Use   Smoking status: Never   Smokeless tobacco: Never  Vaping Use   Vaping Use: Never used  Substance Use Topics   Alcohol use: Not Currently    Comment: 1 X a year   Drug use: No    Past Surgical History:  Procedure Laterality Date   TONSILLECTOMY      Family History  Problem Relation Age of Onset   Hyperlipidemia Mother    Cancer Maternal Grandmother        Cervical   Diabetes Maternal Grandfather    Colon cancer Neg Hx    Breast cancer Neg Hx     Allergies  Allergen Reactions    Cephalosporins Rash   Erythromycin Rash   Penicillins Rash   Sulfa Antibiotics Rash   Ceclor [Cefaclor] Hives   Suprax [Cefixime] Hives   Bupropion Hives    Itching   Septra [Sulfamethoxazole-Trimethoprim] Rash    Current Medications:   Current Outpatient Medications:    FLUoxetine (PROZAC) 20 MG capsule, Take 1 capsule by mouth daily., Disp: 90 capsule, Rfl: 1   nitrofurantoin, macrocrystal-monohydrate, (MACROBID) 100 MG capsule, Take 1 capsule (100 mg total) by mouth as needed., Disp: 90 capsule, Rfl: 0   pantoprazole (PROTONIX) 40 MG tablet, Take 1 tablet (40 mg total) by mouth daily., Disp: 90 tablet, Rfl: 1   propranolol ER (INDERAL LA) 60 MG 24 hr capsule, Take 1 capsule (60 mg total) by mouth daily., Disp: 90 capsule, Rfl: 3   triamcinolone cream (KENALOG) 0.1 %, Apply 1 application topically daily as needed., Disp: , Rfl:    Review of Systems:   ROS Negative unless otherwise specified per HPI.   Vitals:   Vitals:   11/15/21 0944  BP: 120/80  Pulse: 88  Temp: 98.3 F (36.8 C)  TempSrc: Temporal  SpO2: 99%  Weight: 149 lb (67.6 kg)  Height: '5\' 3"'$  (1.6  m)     Body mass index is 26.39 kg/m.  Physical Exam:   Physical Exam Vitals and nursing note reviewed.  Constitutional:      General: She is not in acute distress.    Appearance: She is well-developed. She is not ill-appearing or toxic-appearing.  Cardiovascular:     Rate and Rhythm: Normal rate and regular rhythm.     Pulses: Normal pulses.     Heart sounds: Normal heart sounds, S1 normal and S2 normal.  Pulmonary:     Effort: Pulmonary effort is normal.     Breath sounds: Normal breath sounds.  Abdominal:     General: Abdomen is flat. Bowel sounds are increased.     Tenderness: There is abdominal tenderness in the epigastric area. There is no guarding or rebound.  Skin:    General: Skin is warm and dry.  Neurological:     Mental Status: She is alert.     GCS: GCS eye subscore is 4. GCS verbal  subscore is 5. GCS motor subscore is 6.  Psychiatric:        Speech: Speech normal.        Behavior: Behavior normal. Behavior is cooperative.    Assessment and Plan:   RUQ pain ONgoing Will update blood work today to check lipase Will order ultrasound of RUQ of abdomen for further evaluation of pain If u/s unremarkable and pain persists, will refer to GI  I,Savera Zaman,acting as a scribe for [employer], PA.,have documented all relevant documentation on the behalf of Inda Coke, PA,as directed by  Inda Coke, PA while in the presence of Inda Coke, [place].   I, Inda Coke, [place], have reviewed all documentation for this visit. The documentation on 11/15/21 for the exam, diagnosis, procedures, and orders are all accurate and complete.  Inda Coke, PA-C

## 2021-11-16 ENCOUNTER — Ambulatory Visit (HOSPITAL_BASED_OUTPATIENT_CLINIC_OR_DEPARTMENT_OTHER)
Admission: RE | Admit: 2021-11-16 | Discharge: 2021-11-16 | Disposition: A | Discharge status: Home / Self Care | Payer: No Typology Code available for payment source | Source: Ambulatory Visit | Attending: Physician Assistant | Admitting: Physician Assistant

## 2021-11-16 DIAGNOSIS — R1011 Right upper quadrant pain: Secondary | ICD-10-CM | POA: Insufficient documentation

## 2021-11-17 ENCOUNTER — Encounter: Payer: Self-pay | Admitting: Physician Assistant

## 2021-12-12 ENCOUNTER — Other Ambulatory Visit (HOSPITAL_COMMUNITY): Payer: Self-pay

## 2022-01-02 ENCOUNTER — Other Ambulatory Visit (HOSPITAL_COMMUNITY): Payer: Self-pay

## 2022-03-13 ENCOUNTER — Encounter: Payer: Self-pay | Admitting: *Deleted

## 2022-03-22 ENCOUNTER — Other Ambulatory Visit (HOSPITAL_COMMUNITY): Payer: Self-pay

## 2022-04-03 ENCOUNTER — Other Ambulatory Visit (HOSPITAL_COMMUNITY): Payer: Self-pay

## 2022-04-03 ENCOUNTER — Other Ambulatory Visit: Payer: Self-pay | Admitting: Physician Assistant

## 2022-04-03 MED ORDER — FLUOXETINE HCL 20 MG PO CAPS
20.0000 mg | ORAL_CAPSULE | Freq: Every day | ORAL | 0 refills | Status: DC
  Filled 2022-04-03: qty 30, 30d supply, fill #0

## 2022-04-17 ENCOUNTER — Other Ambulatory Visit (HOSPITAL_COMMUNITY): Payer: Self-pay

## 2022-05-02 ENCOUNTER — Other Ambulatory Visit (HOSPITAL_COMMUNITY): Payer: Self-pay

## 2022-05-02 ENCOUNTER — Other Ambulatory Visit: Payer: Self-pay | Admitting: Physician Assistant

## 2022-05-02 MED ORDER — FLUOXETINE HCL 20 MG PO CAPS
20.0000 mg | ORAL_CAPSULE | Freq: Every day | ORAL | 0 refills | Status: DC
  Filled 2022-05-02: qty 30, 30d supply, fill #0

## 2022-05-03 ENCOUNTER — Other Ambulatory Visit (HOSPITAL_COMMUNITY): Payer: Self-pay

## 2022-06-01 ENCOUNTER — Encounter: Payer: Self-pay | Admitting: *Deleted

## 2022-06-01 ENCOUNTER — Other Ambulatory Visit: Payer: Self-pay | Admitting: Physician Assistant

## 2022-06-02 ENCOUNTER — Other Ambulatory Visit: Payer: Self-pay | Admitting: Physician Assistant

## 2022-06-08 ENCOUNTER — Ambulatory Visit (INDEPENDENT_AMBULATORY_CARE_PROVIDER_SITE_OTHER): Payer: No Typology Code available for payment source | Admitting: Physician Assistant

## 2022-06-08 ENCOUNTER — Encounter: Payer: Self-pay | Admitting: Physician Assistant

## 2022-06-08 ENCOUNTER — Other Ambulatory Visit (HOSPITAL_COMMUNITY): Payer: Self-pay

## 2022-06-08 VITALS — BP 124/80 | HR 80 | Temp 98.2°F | Ht 63.0 in | Wt 153.4 lb

## 2022-06-08 DIAGNOSIS — I471 Supraventricular tachycardia, unspecified: Secondary | ICD-10-CM

## 2022-06-08 DIAGNOSIS — F419 Anxiety disorder, unspecified: Secondary | ICD-10-CM

## 2022-06-08 DIAGNOSIS — E010 Iodine-deficiency related diffuse (endemic) goiter: Secondary | ICD-10-CM

## 2022-06-08 DIAGNOSIS — E559 Vitamin D deficiency, unspecified: Secondary | ICD-10-CM

## 2022-06-08 DIAGNOSIS — Z136 Encounter for screening for cardiovascular disorders: Secondary | ICD-10-CM

## 2022-06-08 DIAGNOSIS — Z0000 Encounter for general adult medical examination without abnormal findings: Secondary | ICD-10-CM | POA: Diagnosis not present

## 2022-06-08 DIAGNOSIS — F32A Depression, unspecified: Secondary | ICD-10-CM | POA: Diagnosis not present

## 2022-06-08 DIAGNOSIS — Z13220 Encounter for screening for lipoid disorders: Secondary | ICD-10-CM | POA: Diagnosis not present

## 2022-06-08 LAB — COMPREHENSIVE METABOLIC PANEL
ALT: 15 U/L (ref 0–35)
AST: 13 U/L (ref 0–37)
Albumin: 4.6 g/dL (ref 3.5–5.2)
Alkaline Phosphatase: 79 U/L (ref 39–117)
BUN: 9 mg/dL (ref 6–23)
CO2: 27 mEq/L (ref 19–32)
Calcium: 9.8 mg/dL (ref 8.4–10.5)
Chloride: 106 mEq/L (ref 96–112)
Creatinine, Ser: 0.66 mg/dL (ref 0.40–1.20)
GFR: 112.74 mL/min (ref >60.00)
Glucose, Bld: 93 mg/dL (ref 70–99)
Potassium: 4.4 mEq/L (ref 3.5–5.1)
Sodium: 140 mEq/L (ref 135–145)
Total Bilirubin: 0.4 mg/dL (ref 0.2–1.2)
Total Protein: 7.1 g/dL (ref 6.0–8.3)

## 2022-06-08 LAB — CBC WITH DIFFERENTIAL/PLATELET
Basophils Absolute: 0.1 10*3/uL (ref 0.0–0.1)
Basophils Relative: 1.1 % (ref 0.0–3.0)
Eosinophils Absolute: 0.4 10*3/uL (ref 0.0–0.7)
Eosinophils Relative: 6.1 % — ABNORMAL HIGH (ref 0.0–5.0)
HCT: 40 % (ref 36.0–46.0)
Hemoglobin: 13.4 g/dL (ref 12.0–15.0)
Lymphocytes Relative: 41.4 % (ref 12.0–46.0)
Lymphs Abs: 3 10*3/uL (ref 0.7–4.0)
MCHC: 33.5 g/dL (ref 30.0–36.0)
MCV: 86.4 fl (ref 78.0–100.0)
Monocytes Absolute: 0.6 10*3/uL (ref 0.1–1.0)
Monocytes Relative: 8.3 % (ref 3.0–12.0)
Neutro Abs: 3.1 10*3/uL (ref 1.4–7.7)
Neutrophils Relative %: 43.1 % (ref 43.0–77.0)
Platelets: 265 10*3/uL (ref 150.0–400.0)
RBC: 4.63 Mil/uL (ref 3.87–5.11)
RDW: 14.7 % (ref 11.5–15.5)
WBC: 7.1 10*3/uL (ref 4.0–10.5)

## 2022-06-08 LAB — LIPID PANEL
Cholesterol: 154 mg/dL (ref 0–200)
HDL: 49.5 mg/dL (ref >39.00)
LDL Cholesterol: 76 mg/dL (ref 0–99)
NonHDL: 104
Total CHOL/HDL Ratio: 3
Triglycerides: 141 mg/dL (ref 0.0–149.0)
VLDL: 28.2 mg/dL (ref 0.0–40.0)

## 2022-06-08 LAB — VITAMIN D 25 HYDROXY (VIT D DEFICIENCY, FRACTURES): VITD: 27.52 ng/mL — ABNORMAL LOW (ref 30.00–100.00)

## 2022-06-08 LAB — TSH: TSH: 0.85 u[IU]/mL (ref 0.35–5.50)

## 2022-06-08 LAB — VITAMIN B12: Vitamin B-12: 755 pg/mL (ref 211–911)

## 2022-06-08 MED ORDER — SERTRALINE HCL 50 MG PO TABS
ORAL_TABLET | ORAL | 1 refills | Status: DC
  Filled 2022-06-08: qty 90, 60d supply, fill #0
  Filled 2022-08-01: qty 60, 30d supply, fill #1
  Filled 2022-08-30: qty 30, 15d supply, fill #2

## 2022-06-08 NOTE — Progress Notes (Signed)
Subjective:    Dorothy Fernandez is a 36 y.o. female and is here for a comprehensive physical exam.  HPI  Health Maintenance Due  Topic Date Due   PAP SMEAR-Modifier  07/08/2021   INFLUENZA VACCINE  01/17/2022   COVID-19 Vaccine (3 - 2023-24 season) 02/17/2022    Acute Concerns: Enlarged thyroid She has concerns regarding puffiness of neck and wonders if she has a thyroid problem. She is unsure if this is chronic or not. Lab Results  Component Value Date   TSH 0.58 09/13/2021     Chronic Issues: Anxiety/Depression Current medications: Prozac '20mg'$  daily. She feels that the Prozac helps somewhat but states that her energy is low and libido is very low. She feels that her anxiety and depression are mildly worse since last visit. She would like to try switching back to Zoloft as this worked very well for her in the past. She was on Zoloft '50mg'$  at her highest dose previously. Denies DI/HI.   H/o SVT Current medications: Inderal LA '60mg'$  daily.  Tachycardia well controlled with this regimen. Also helps with her anxiety. Denies any leg swelling.   Health Maintenance: Immunizations -- UTD Colonoscopy -- N/A Mammogram -- N/A PAP -- Last completed in 2020 Bone Density -- N/A Diet -- Healthy, balanced diet Exercise -- Exercising once a week- walks neighborhood and video-guided strength training at home  Sleep habits -- Sleeps well but sometimes doesn't sleep enough when caring for her young children Mood -- Increased depression/anxiety as noted above  UTD with dentist? - No UTD with eye doctor? - Yes, wears contact lenses   Weight history: Wt Readings from Last 10 Encounters:  06/08/22 153 lb 6.1 oz (69.6 kg)  11/15/21 149 lb (67.6 kg)  09/13/21 146 lb 6.4 oz (66.4 kg)  05/02/21 141 lb (64 kg)  12/21/20 139 lb 6.4 oz (63.2 kg)  09/27/20 158 lb 9 oz (71.9 kg)  11/03/19 130 lb 8 oz (59.2 kg)  06/14/19 145 lb 4.8 oz (65.9 kg)  12/23/18 120 lb 9.6 oz (54.7 kg)   12/21/18 120 lb 4 oz (54.5 kg)   Body mass index is 27.17 kg/m. Patient's last menstrual period was 06/07/2022 (exact date).  Alcohol use:  reports that she does not currently use alcohol.  Tobacco use:  Tobacco Use: Low Risk  (06/08/2022)   Patient History    Smoking Tobacco Use: Never    Smokeless Tobacco Use: Never    Passive Exposure: Not on file   Eligible for lung cancer screening? no     09/13/2021   10:06 AM  Depression screen PHQ 2/9  Decreased Interest 2  Down, Depressed, Hopeless 1  PHQ - 2 Score 3  Altered sleeping 3  Tired, decreased energy 3  Change in appetite 2  Feeling bad or failure about yourself  3  Trouble concentrating 3  Moving slowly or fidgety/restless 0  Suicidal thoughts 0  PHQ-9 Score 17  Difficult doing work/chores Extremely dIfficult     Other providers/specialists: Patient Care Team: Inda Coke, [place] as PCP - General (Physician Assistant)    PMHx, SurgHx, SocialHx, Medications, and Allergies were reviewed in the Visit Navigator and updated as appropriate.   Past Medical History:  Diagnosis Date   Anxiety    Dysrhythmia    propanolol for SVT   Fibroid    Nephrolithiasis    Tachycardia      Past Surgical History:  Procedure Laterality Date   TONSILLECTOMY  Family History  Problem Relation Age of Onset   Hyperlipidemia Mother    Cancer Maternal Grandmother        Cervical   Diabetes Maternal Grandfather    Colon cancer Neg Hx    Breast cancer Neg Hx     Social History   Tobacco Use   Smoking status: Never   Smokeless tobacco: Never  Vaping Use   Vaping Use: Never used  Substance Use Topics   Alcohol use: Not Currently    Comment: 1 X a year   Drug use: No    Review of Systems:   Review of Systems  Constitutional:  Positive for malaise/fatigue. Negative for chills, fever and weight loss.  HENT:  Negative for hearing loss, sinus pain and sore throat.   Respiratory:  Negative for cough and  hemoptysis.   Cardiovascular:  Negative for chest pain, palpitations, leg swelling and PND.  Gastrointestinal:  Negative for abdominal pain, constipation, diarrhea, heartburn, nausea and vomiting.  Genitourinary:  Negative for dysuria, frequency and urgency.  Musculoskeletal:  Negative for back pain, myalgias and neck pain.  Skin:  Negative for itching and rash.  Neurological:  Negative for dizziness, tingling, seizures and headaches.  Endo/Heme/Allergies:  Negative for polydipsia.  Psychiatric/Behavioral:  Positive for depression. Negative for hallucinations and suicidal ideas. The patient is nervous/anxious.     Objective:   BP 124/80 (BP Location: Left Arm, Patient Position: Sitting, Cuff Size: Normal)   Pulse 80   Temp 98.2 F (36.8 C) (Temporal)   Ht '5\' 3"'$  (1.6 m)   Wt 153 lb 6.1 oz (69.6 kg)   LMP 06/07/2022 (Exact Date)   SpO2 98%   BMI 27.17 kg/m  Body mass index is 27.17 kg/m.   General Appearance:    Alert, cooperative, no distress, appears stated age  Head:    Normocephalic, without obvious abnormality, atraumatic  Eyes:    PERRL, conjunctiva/corneas clear, EOM's intact, fundi    benign, both eyes  Ears:    Normal TM's and external ear canals, both ears  Nose:   Nares normal, septum midline, mucosa normal, no drainage    or sinus tenderness  Throat:   Lips, mucosa, and tongue normal; teeth and gums normal  Neck:   Supple, symmetrical, trachea midline, no adenopathy;    thyroid:  slight enlargement R>L, /tenderness/nodules; no carotid   bruit or JVD  Back:     Symmetric, no curvature, ROM normal, no CVA tenderness  Lungs:     Clear to auscultation bilaterally, respirations unlabored  Chest Wall:    No tenderness or deformity   Heart:    Regular rate and rhythm, S1 and S2 normal, no murmur, rub or gallop  Breast Exam:    Deferred  Abdomen:     Soft, non-tender, bowel sounds active all four quadrants,    no masses, no organomegaly  Genitalia:    Deferred   Extremities:   Extremities normal, atraumatic, no cyanosis or edema  Pulses:   2+ and symmetric all extremities  Skin:   Skin color, texture, turgor normal, no rashes or lesions  Lymph nodes:   Cervical, supraclavicular, and axillary nodes normal  Neurologic:   CNII-XII intact, normal strength, sensation and reflexes    throughout    Assessment/Plan:   Routine physical examination Today patient counseled on age appropriate routine health concerns for screening and prevention, each reviewed and up to date or declined. Immunizations reviewed and up to date or declined. Labs ordered and reviewed.  Risk factors for depression reviewed and negative. Hearing function and visual acuity are intact. ADLs screened and addressed as needed. Functional ability and level of safety reviewed and appropriate. Education, counseling and referrals performed based on assessed risks today. Patient provided with a copy of personalized plan for preventive services.  Anxiety and depression Uncontrolled Stop prozac Start zoloft 50 mg and after 4 weeks, may increase to 100 mg daily if needed Follow-up in 3-6 mo, sooner if concerns I discussed with patient that if they develop any SI, to tell someone immediately and seek medical attention. Consider trintellix of sexual dysfunction with zoloft  Thyromegaly Will obtain TSH and thyroid u/s Follow-up based on results  Vitamin D deficiency Update Vit D and provide recommendations accordingly  Encounter for lipid screening for cardiovascular disease Update lipid panel and provide recommendations accordingly  Paroxysmal SVT (supraventricular tachycardia) Well controlled Continue propranolol ER 60 mg daily Follow-up in 1 year, sooner if concerns  I,Alexis Herring,acting as a scribe for [employer], PA.,have documented all relevant documentation on the behalf of Inda Coke, PA,as directed by  Inda Coke, PA while in the presence of Inda Coke,  [place].  I, Inda Coke, [place], have reviewed all documentation for this visit. The documentation on 06/08/22 for the exam, diagnosis, procedures, and orders are all accurate and complete.  Inda Coke, PA-C [place]

## 2022-06-08 NOTE — Patient Instructions (Signed)
It was great to see you!  I will place referral for thyroid ultrasound  Stop prozac and start zoloft 50 mg, increase to 100 mg after 4 weeks Follow-up in 6 months for this  Please go to the lab for blood work.   Our office will call you with your results unless you have chosen to receive results via MyChart.  If your blood work is normal we will follow-up each year for physicals and as scheduled for chronic medical problems.  If anything is abnormal we will treat accordingly and get you in for a follow-up.  Take care,  Aldona Bar

## 2022-06-28 ENCOUNTER — Other Ambulatory Visit (HOSPITAL_COMMUNITY): Payer: Self-pay

## 2022-06-29 ENCOUNTER — Ambulatory Visit
Admission: RE | Admit: 2022-06-29 | Discharge: 2022-06-29 | Disposition: A | Discharge status: Home / Self Care | Payer: 59 | Source: Ambulatory Visit | Attending: Physician Assistant | Admitting: Physician Assistant

## 2022-06-29 DIAGNOSIS — E010 Iodine-deficiency related diffuse (endemic) goiter: Secondary | ICD-10-CM

## 2022-06-29 DIAGNOSIS — E041 Nontoxic single thyroid nodule: Secondary | ICD-10-CM | POA: Diagnosis not present

## 2022-06-30 ENCOUNTER — Encounter: Payer: Self-pay | Admitting: Physician Assistant

## 2022-06-30 DIAGNOSIS — E041 Nontoxic single thyroid nodule: Secondary | ICD-10-CM | POA: Insufficient documentation

## 2022-08-01 ENCOUNTER — Other Ambulatory Visit: Payer: Self-pay

## 2022-08-01 ENCOUNTER — Other Ambulatory Visit (HOSPITAL_COMMUNITY): Payer: Self-pay

## 2022-08-15 ENCOUNTER — Telehealth: Payer: 59 | Admitting: Physician Assistant

## 2022-08-17 ENCOUNTER — Other Ambulatory Visit (HOSPITAL_COMMUNITY): Payer: Self-pay

## 2022-08-17 ENCOUNTER — Ambulatory Visit (INDEPENDENT_AMBULATORY_CARE_PROVIDER_SITE_OTHER): Payer: 59 | Admitting: Physician Assistant

## 2022-08-17 ENCOUNTER — Encounter: Payer: Self-pay | Admitting: Physician Assistant

## 2022-08-17 VITALS — BP 110/80 | HR 78 | Temp 97.8°F | Ht 63.0 in | Wt 155.2 lb

## 2022-08-17 DIAGNOSIS — R051 Acute cough: Secondary | ICD-10-CM

## 2022-08-17 MED ORDER — DOXYCYCLINE HYCLATE 100 MG PO TABS
100.0000 mg | ORAL_TABLET | Freq: Two times a day (BID) | ORAL | 0 refills | Status: DC
  Filled 2022-08-17: qty 14, 7d supply, fill #0

## 2022-08-17 NOTE — Progress Notes (Signed)
Dorothy Fernandez is a 37 y.o. female here for a new problem.  History of Present Illness:   Chief Complaint  Patient presents with   Cough    Pt c/o cough x 4 weeks, coughing and expectorating green/yellow sputum x 3 weeks. Has tried Sudafed, benadryl. Denies fever or chills. Covid test last night Neg.    URI:  She complains of a persistent cough beginning 4 weeks ago.  She endorses sinus congestion and drainage of green mucus.  She has taken an at home Covid test, results were negative.  Denies fever, chills, shortness of breath, chest pain. Her children have been sick at home.  Past Medical History:  Diagnosis Date   Anxiety    Dysrhythmia    propanolol for SVT   Fibroid    Nephrolithiasis    Tachycardia      Social History   Tobacco Use   Smoking status: Never   Smokeless tobacco: Never  Vaping Use   Vaping Use: Never used  Substance Use Topics   Alcohol use: Not Currently    Comment: 1 X a year   Drug use: No    Past Surgical History:  Procedure Laterality Date   TONSILLECTOMY      Family History  Problem Relation Age of Onset   Hyperlipidemia Mother    Cancer Maternal Grandmother        Cervical   Diabetes Maternal Grandfather    Colon cancer Neg Hx    Breast cancer Neg Hx     Allergies  Allergen Reactions   Cephalosporins Rash   Erythromycin Rash   Penicillins Rash   Sulfa Antibiotics Rash   Ceclor [Cefaclor] Hives   Suprax [Cefixime] Hives   Bupropion Hives    Itching   Septra [Sulfamethoxazole-Trimethoprim] Rash    Current Medications:   Current Outpatient Medications:    Alpha-Lipoic Acid 100 MG CAPS, Take 1 capsule by mouth daily in the afternoon., Disp: , Rfl:    cholecalciferol (VITAMIN D3) 25 MCG (1000 UNIT) tablet, Take 1,000 Units by mouth daily., Disp: , Rfl:    Coenzyme Q10 (COQ-10) 100 MG CAPS, Take 1 capsule by mouth daily in the afternoon., Disp: , Rfl:    Docosahexaenoic Acid (DHA ALGAL-900 PO), Take 1 capsule by mouth daily  in the afternoon., Disp: , Rfl:    doxycycline (VIBRA-TABS) 100 MG tablet, Take 1 tablet (100 mg total) by mouth 2 (two) times daily., Disp: 14 tablet, Rfl: 0   Levomefolate Glucosamine (METHYL-FOLATE) 1700 MCG CAPS, Take 1 capsule by mouth daily in the afternoon., Disp: , Rfl:    Methylcobalamin (METHYL B-12) 1000 MCG LOZG, Place 1 tablet under the tongue daily in the afternoon., Disp: , Rfl:    Multiple Vitamins-Minerals (MULTIVIT/MULTIMINERAL ADULT PO), Take 1 tablet by mouth daily in the afternoon., Disp: , Rfl:    NADH-Ascorbic Acid-Sod Bicarb (ENADA NADH PO), Take 1 capsule by mouth daily in the afternoon., Disp: , Rfl:    nitrofurantoin, macrocrystal-monohydrate, (MACROBID) 100 MG capsule, Take 1 capsule (100 mg total) by mouth as needed., Disp: 90 capsule, Rfl: 0   propranolol ER (INDERAL LA) 60 MG 24 hr capsule, Take 1 capsule (60 mg total) by mouth daily., Disp: 90 capsule, Rfl: 3   sertraline (ZOLOFT) 50 MG tablet, Take 1 tablet (50 mg total) by mouth daily for 4 weeks. Then increase to 2 tablets (100 mg total) by mouth daily if desired (Patient taking differently: Take 100 mg by mouth daily. Take 1 tablet (  50 mg total) by mouth daily for 4 weeks. Then increase to 2 tablets (100 mg total) by mouth daily if desired), Disp: 90 tablet, Rfl: 1   triamcinolone cream (KENALOG) 0.1 %, Apply 1 application topically daily as needed., Disp: , Rfl:    Review of Systems:   Review of Systems  HENT:  Positive for congestion (sinus congestion).   Respiratory:  Positive for cough and sputum production (green).     Vitals:   Vitals:   08/17/22 1103  BP: 110/80  Pulse: 78  Temp: 97.8 F (36.6 C)  TempSrc: Temporal  SpO2: 98%  Weight: 155 lb 4 oz (70.4 kg)  Height: '5\' 3"'$  (1.6 m)     Body mass index is 27.5 kg/m.  Physical Exam:   Physical Exam Vitals and nursing note reviewed.  Constitutional:      General: She is not in acute distress.    Appearance: She is well-developed. She is not  ill-appearing or toxic-appearing.  HENT:     Head: Normocephalic and atraumatic.     Right Ear: Tympanic membrane, ear canal and external ear normal. Tympanic membrane is not erythematous, retracted or bulging.     Left Ear: Tympanic membrane, ear canal and external ear normal. Tympanic membrane is not erythematous, retracted or bulging.     Nose: Nose normal.     Right Sinus: No maxillary sinus tenderness or frontal sinus tenderness.     Left Sinus: No maxillary sinus tenderness or frontal sinus tenderness.     Mouth/Throat:     Pharynx: Uvula midline. No posterior oropharyngeal erythema.  Eyes:     General: Lids are normal.     Conjunctiva/sclera: Conjunctivae normal.  Neck:     Trachea: Trachea normal.  Cardiovascular:     Rate and Rhythm: Normal rate and regular rhythm.     Heart sounds: Normal heart sounds, S1 normal and S2 normal.  Pulmonary:     Effort: Pulmonary effort is normal.     Breath sounds: Normal breath sounds. No decreased breath sounds, wheezing, rhonchi or rales.  Lymphadenopathy:     Cervical: No cervical adenopathy.  Skin:    General: Skin is warm and dry.  Neurological:     Mental Status: She is alert.  Psychiatric:        Speech: Speech normal.        Behavior: Behavior normal. Behavior is cooperative.     Assessment and Plan:   Acute cough No red flags on exam.  Will initiate doxycycline per orders. Discussed taking medications as prescribed. Reviewed return precautions including worsening fever, SOB, worsening cough or other concerns. Push fluids and rest. I recommend that patient follow-up if symptoms worsen or persist despite treatment x 7-10 days, sooner if needed.   I,Rachel Rivera,acting as a Education administrator for [employer], PA.,have documented all relevant documentation on the behalf of Inda Coke, PA,as directed by  Inda Coke, PA while in the presence of Inda Coke, [place].  I, Inda Coke, [place], have reviewed all documentation for  this visit. The documentation on 08/17/22 for the exam, diagnosis, procedures, and orders are all accurate and complete.   Inda Coke, PA-C

## 2022-08-18 DIAGNOSIS — N8003 Adenomyosis of the uterus: Secondary | ICD-10-CM

## 2022-08-18 HISTORY — DX: Adenomyosis of the uterus: N80.03

## 2022-08-28 DIAGNOSIS — D259 Leiomyoma of uterus, unspecified: Secondary | ICD-10-CM | POA: Diagnosis not present

## 2022-08-28 DIAGNOSIS — O99411 Diseases of the circulatory system complicating pregnancy, first trimester: Secondary | ICD-10-CM | POA: Diagnosis not present

## 2022-08-28 DIAGNOSIS — Z01419 Encounter for gynecological examination (general) (routine) without abnormal findings: Secondary | ICD-10-CM | POA: Diagnosis not present

## 2022-08-30 ENCOUNTER — Other Ambulatory Visit (HOSPITAL_COMMUNITY): Payer: Self-pay

## 2022-09-05 ENCOUNTER — Encounter (INDEPENDENT_AMBULATORY_CARE_PROVIDER_SITE_OTHER): Payer: 59 | Admitting: Ophthalmology

## 2022-09-05 ENCOUNTER — Other Ambulatory Visit (HOSPITAL_COMMUNITY): Payer: Self-pay

## 2022-09-05 DIAGNOSIS — H43813 Vitreous degeneration, bilateral: Secondary | ICD-10-CM

## 2022-09-05 DIAGNOSIS — H33102 Unspecified retinoschisis, left eye: Secondary | ICD-10-CM | POA: Diagnosis not present

## 2022-09-05 DIAGNOSIS — H33012 Retinal detachment with single break, left eye: Secondary | ICD-10-CM | POA: Diagnosis not present

## 2022-09-05 MED ORDER — PREDNISOLONE ACETATE 1 % OP SUSP
1.0000 [drp] | Freq: Four times a day (QID) | OPHTHALMIC | 0 refills | Status: DC
  Filled 2022-09-05: qty 5, 25d supply, fill #0

## 2022-09-11 DIAGNOSIS — D259 Leiomyoma of uterus, unspecified: Secondary | ICD-10-CM | POA: Diagnosis not present

## 2022-09-12 ENCOUNTER — Other Ambulatory Visit: Payer: Self-pay | Admitting: Physician Assistant

## 2022-09-13 ENCOUNTER — Other Ambulatory Visit (HOSPITAL_COMMUNITY): Payer: Self-pay

## 2022-09-13 MED ORDER — SERTRALINE HCL 50 MG PO TABS
ORAL_TABLET | ORAL | 1 refills | Status: DC
  Filled 2022-09-13: qty 90, 59d supply, fill #0
  Filled 2022-10-31: qty 60, 30d supply, fill #1

## 2022-09-14 ENCOUNTER — Other Ambulatory Visit (HOSPITAL_COMMUNITY): Payer: Self-pay

## 2022-09-14 ENCOUNTER — Other Ambulatory Visit: Payer: Self-pay | Admitting: Physician Assistant

## 2022-09-14 MED ORDER — PROPRANOLOL HCL ER 60 MG PO CP24
60.0000 mg | ORAL_CAPSULE | Freq: Every day | ORAL | 3 refills | Status: DC
  Filled 2022-09-14: qty 90, 90d supply, fill #0
  Filled 2022-12-20: qty 90, 90d supply, fill #1
  Filled 2023-03-20: qty 90, 90d supply, fill #2
  Filled 2023-06-16: qty 90, 90d supply, fill #3

## 2022-09-18 ENCOUNTER — Encounter (INDEPENDENT_AMBULATORY_CARE_PROVIDER_SITE_OTHER): Payer: 59 | Admitting: Ophthalmology

## 2022-09-18 DIAGNOSIS — H33302 Unspecified retinal break, left eye: Secondary | ICD-10-CM

## 2022-10-24 DIAGNOSIS — R5383 Other fatigue: Secondary | ICD-10-CM | POA: Diagnosis not present

## 2022-10-24 DIAGNOSIS — E538 Deficiency of other specified B group vitamins: Secondary | ICD-10-CM | POA: Diagnosis not present

## 2022-10-24 DIAGNOSIS — E559 Vitamin D deficiency, unspecified: Secondary | ICD-10-CM | POA: Diagnosis not present

## 2022-10-24 DIAGNOSIS — Z8616 Personal history of COVID-19: Secondary | ICD-10-CM | POA: Diagnosis not present

## 2022-10-24 DIAGNOSIS — R140 Abdominal distension (gaseous): Secondary | ICD-10-CM | POA: Diagnosis not present

## 2022-10-25 ENCOUNTER — Other Ambulatory Visit (HOSPITAL_COMMUNITY): Payer: Self-pay

## 2022-10-25 ENCOUNTER — Encounter: Payer: Self-pay | Admitting: Dermatology

## 2022-10-25 ENCOUNTER — Ambulatory Visit: Payer: 59 | Admitting: Dermatology

## 2022-10-25 VITALS — BP 105/70

## 2022-10-25 DIAGNOSIS — D229 Melanocytic nevi, unspecified: Secondary | ICD-10-CM

## 2022-10-25 DIAGNOSIS — D1801 Hemangioma of skin and subcutaneous tissue: Secondary | ICD-10-CM | POA: Diagnosis not present

## 2022-10-25 DIAGNOSIS — L578 Other skin changes due to chronic exposure to nonionizing radiation: Secondary | ICD-10-CM

## 2022-10-25 DIAGNOSIS — L814 Other melanin hyperpigmentation: Secondary | ICD-10-CM | POA: Diagnosis not present

## 2022-10-25 DIAGNOSIS — W908XXA Exposure to other nonionizing radiation, initial encounter: Secondary | ICD-10-CM

## 2022-10-25 DIAGNOSIS — X32XXXA Exposure to sunlight, initial encounter: Secondary | ICD-10-CM

## 2022-10-25 DIAGNOSIS — L821 Other seborrheic keratosis: Secondary | ICD-10-CM

## 2022-10-25 DIAGNOSIS — Z1283 Encounter for screening for malignant neoplasm of skin: Secondary | ICD-10-CM

## 2022-10-25 DIAGNOSIS — L918 Other hypertrophic disorders of the skin: Secondary | ICD-10-CM | POA: Diagnosis not present

## 2022-10-25 MED ORDER — LIDOCAINE 5 % EX OINT
TOPICAL_OINTMENT | CUTANEOUS | 0 refills | Status: DC | PRN
  Filled 2022-10-25: qty 35.44, 15d supply, fill #0
  Filled 2022-10-25: qty 35.44, 20d supply, fill #0

## 2022-10-25 NOTE — Progress Notes (Signed)
   New Patient Visit   Subjective  Dorothy Fernandez is a 37 y.o. female who presents for the following: Skin Cancer Screening and Full Body Skin Exam  The patient presents for Total-Body Skin Exam (TBSE) for skin cancer screening and mole check. The patient has spots, moles and lesions to be evaluated, some may be new or changing and the patient has concerns that these could be cancer.  No personal or family history of skin cancer. She is interested in having moles and skin tags removed. And if there is treatment available for angiomas.     The following portions of the chart were reviewed this encounter and updated as appropriate: medications, allergies, medical history  Review of Systems:  No other skin or systemic complaints except as noted in HPI or Assessment and Plan.  Objective  Well appearing patient in no apparent distress; mood and affect are within normal limits.  A full examination was performed including scalp, head, eyes, ears, nose, lips, neck, chest, axillae, abdomen, back, buttocks, bilateral upper extremities, bilateral lower extremities, hands, feet, fingers, toes, fingernails, and toenails. All findings within normal limits unless otherwise noted below.   Relevant physical exam findings are noted in the Assessment and Plan.    Assessment & Plan   LENTIGINES, SEBORRHEIC KERATOSES, HEMANGIOMAS - Benign normal skin lesions - Benign-appearing - Call for any changes  MELANOCYTIC NEVI - Tan-brown and/or pink-flesh-colored symmetric macules and papules - Benign appearing on exam today - Observation - Call clinic for new or changing moles - Recommend daily use of broad spectrum spf 30+ sunscreen to sun-exposed areas.   MILD ACTINIC DAMAGE - Chronic condition, secondary to cumulative UV/sun exposure - diffuse scaly erythematous macules with underlying dyspigmentation - Recommend daily broad spectrum sunscreen SPF 30+ to sun-exposed areas, reapply every 2 hours as  needed.  - Staying in the shade or wearing long sleeves, sun glasses (UVA+UVB protection) and wide brim hats (4-inch brim around the entire circumference of the hat) are also recommended for sun protection.  - Call for new or changing lesions.  Acrochordons (Skin Tags) - Fleshy, skin-colored pedunculated papules - Benign appearing.  - Observe. - If desired, they can be removed with an in office procedure that is not covered by insurance. - Please call the clinic if you notice any new or changing lesions.   Patient will make a separate cosmetic appointment for removal of all tags on the neck. Quoted $250.00-300.lidocaine cream to be sent to pharmacy for her to apply 1 hour prior to procedure if desired  Cherry Angiomas Exam: red papule(s) Discussed benign nature. Recommend observation. Call for changes.   Quoted $300.00 per arm if she desires cosmetic cauterization  SKIN CANCER SCREENING PERFORMED TODAY.      No follow-ups on file.  Jaclynn Guarneri, CMA, am acting as scribe for Langston Reusing, MD.   Documentation: I have reviewed the above documentation for accuracy and completeness, and I agree with the above.  Langston Reusing, MD

## 2022-10-25 NOTE — Patient Instructions (Addendum)
    Due to recent changes in healthcare laws, you may see results of your pathology and/or laboratory studies on MyChart before the doctors have had a chance to review them. We understand that in some cases there may be results that are confusing or concerning to you. Please understand that not all results are received at the same time and often the doctors may need to interpret multiple results in order to provide you with the best plan of care or course of treatment. Therefore, we ask that you please give us 2 business days to thoroughly review all your results before contacting the office for clarification. Should we see a critical lab result, you will be contacted sooner.   If You Need Anything After Your Visit  If you have any questions or concerns for your doctor, please call our main line at [phone] If no one answers, please leave a voicemail as directed and we will return your call as soon as possible. Messages left after 4 pm will be answered the following business day.   You may also send us a message via MyChart. We typically respond to MyChart messages within 1-2 business days.  For prescription refills, please ask your pharmacy to contact our office. Our fax number is [phone].  If you have an urgent issue when the clinic is closed that cannot wait until the next business day, you can page your doctor at the number below.    Please note that while we do our best to be available for urgent issues outside of office hours, we are not available 24/7.   If you have an urgent issue and are unable to reach us, you may choose to seek medical care at your doctor's office, retail clinic, urgent care center, or emergency room.  If you have a medical emergency, please immediately call 911 or go to the emergency department. In the event of inclement weather, please call our main line at [phone] for an update on the status of any delays or closures.  Dermatology Medication  Tips: Please keep the boxes that topical medications come in in order to help keep track of the instructions about where and how to use these. Pharmacies typically print the medication instructions only on the boxes and not directly on the medication tubes.   If your medication is too expensive, please contact our office at [phone] or send us a message through MyChart.   We are unable to tell what your co-pay for medications will be in advance as this is different depending on your insurance coverage. However, we may be able to find a substitute medication at lower cost or fill out paperwork to get insurance to cover a needed medication.   If a prior authorization is required to get your medication covered by your insurance company, please allow us 1-2 business days to complete this process.  Drug prices often vary depending on where the prescription is filled and some pharmacies may offer cheaper prices.  The website www.goodrx.com contains coupons for medications through different pharmacies. The prices here do not account for what the cost may be with help from insurance (it may be cheaper with your insurance), but the website can give you the price if you did not use any insurance.  - You can print the associated coupon and take it with your prescription to the pharmacy.  - You may also stop by our office during regular business hours and pick up a GoodRx coupon card.  -   If you need your prescription sent electronically to a different pharmacy, notify our office through  MyChart or by phone at [phone]    Skin Education :   I counseled the patient regarding the following: Sun screen (SPF 30 or greater) should be applied during peak UV exposure (between 10am and 2pm) and reapplied after exercise or swimming.  The ABCDEs of melanoma were reviewed with the patient, and the importance of monthly self-examination of moles was emphasized. Should any moles change in shape or  color, or itch, bleed or burn, pt will contact our office for evaluation sooner then their interval appointment.  Plan: Sunscreen Recommendations I recommended a broad spectrum sunscreen with a SPF of 30 or higher. I explained that SPF 30 sunscreens block approximately 97 percent of the sun's harmful rays. Sunscreens should be applied at least 15 minutes prior to expected sun exposure and then every 2 hours after that as long as sun exposure continues. If swimming or exercising sunscreen should be reapplied every 45 minutes to an hour after getting wet or sweating. One ounce, or the equivalent of a shot glass full of sunscreen, is adequate to protect the skin not covered by a bathing suit. I also recommended a lip balm with a sunscreen as well. Sun protective clothing can be used in lieu of sunscreen but must be worn the entire time you are exposed to the sun's rays.  

## 2022-10-31 ENCOUNTER — Other Ambulatory Visit: Payer: Self-pay | Admitting: Physician Assistant

## 2022-10-31 ENCOUNTER — Other Ambulatory Visit (HOSPITAL_COMMUNITY): Payer: Self-pay

## 2022-10-31 DIAGNOSIS — N959 Unspecified menopausal and perimenopausal disorder: Secondary | ICD-10-CM | POA: Diagnosis not present

## 2022-10-31 MED ORDER — SERTRALINE HCL 50 MG PO TABS
100.0000 mg | ORAL_TABLET | Freq: Every day | ORAL | 1 refills | Status: DC
  Filled 2022-10-31: qty 180, 90d supply, fill #0
  Filled 2023-01-25: qty 180, 90d supply, fill #1

## 2022-11-01 ENCOUNTER — Other Ambulatory Visit (HOSPITAL_COMMUNITY): Payer: Self-pay

## 2022-11-21 ENCOUNTER — Ambulatory Visit (INDEPENDENT_AMBULATORY_CARE_PROVIDER_SITE_OTHER): Payer: 59 | Admitting: Dermatology

## 2022-11-21 ENCOUNTER — Encounter: Payer: Self-pay | Admitting: Dermatology

## 2022-11-21 DIAGNOSIS — L918 Other hypertrophic disorders of the skin: Secondary | ICD-10-CM

## 2022-11-21 NOTE — Patient Instructions (Addendum)
Post procedure wound care  Wash gently with soap and water everyday.   Apply LaRoche B5 cream and Vaseline daily until healed. Due to recent changes in healthcare laws, you may see results of your pathology and/or laboratory studies on MyChart before the doctors have had a chance to review them. We understand that in some cases there may be results that are confusing or concerning to you. Please understand that not all results are received at the same time and often the doctors may need to interpret multiple results in order to provide you with the best plan of care or course of treatment. Therefore, we ask that you please give [place] 2 business days to thoroughly review all your results before contacting the office for clarification. Should we see a critical lab result, you will be contacted sooner.   If You Need Anything After Your Visit  If you have any questions or concerns for your doctor, please call our main line at [phone] If no one answers, please leave a voicemail as directed and we will return your call as soon as possible. Messages left after 4 pm will be answered the following business day.   You may also send [place] a message via MyChart. We typically respond to MyChart messages within 1-2 business days.  For prescription refills, please ask your pharmacy to contact our office. Our fax number is [phone].  If you have an urgent issue when the clinic is closed that cannot wait until the next business day, you can page your doctor at the number below.    Please note that while we do our best to be available for urgent issues outside of office hours, we are not available 24/7.   If you have an urgent issue and are unable to reach [place], you may choose to seek medical care at your doctor's office, retail clinic, urgent care center, or emergency room.  If you have a medical emergency, please immediately call 911 or go to the emergency department. In the event of inclement weather, please  call our main line at [phone] for an update on the status of any delays or closures.  Dermatology Medication Tips: Please keep the boxes that topical medications come in in order to help keep track of the instructions about where and how to use these. Pharmacies typically print the medication instructions only on the boxes and not directly on the medication tubes.   If your medication is too expensive, please contact our office at [phone] or send [place] a message through MyChart.   We are unable to tell what your co-pay for medications will be in advance as this is different depending on your insurance coverage. However, we may be able to find a substitute medication at lower cost or fill out paperwork to get insurance to cover a needed medication.   If a prior authorization is required to get your medication covered by your insurance company, please allow [place] 1-2 business days to complete this process.  Drug prices often vary depending on where the prescription is filled and some pharmacies may offer cheaper prices.  The website www.goodrx.com contains coupons for medications through different pharmacies. The prices here do not account for what the cost may be with help from insurance (it may be cheaper with your insurance), but the website can give you the price if you did not use any insurance.  - You can print the associated coupon and take it with your prescription to the pharmacy.  - You  may also stop by our office during regular business hours and pick up a GoodRx coupon card.  - If you need your prescription sent electronically to a different pharmacy, notify our office through [facility] or by phone at [phone]

## 2022-11-21 NOTE — Progress Notes (Unsigned)
   Follow-Up Visit   Subjective  Dorothy Fernandez is a 37 y.o. female who presents for the following: Cosmetic removal of skin tags on the neck   The following portions of the chart were reviewed this encounter and updated as appropriate: medications, allergies, medical history  Review of Systems:  No other skin or systemic complaints except as noted in HPI or Assessment and Plan.  Objective  Well appearing patient in no apparent distress; mood and affect are within normal limits.   A focused examination was performed of the following areas: Neck  Relevant exam findings are noted in the Assessment and Plan.  bilateral neck (40) Pedunculated fleshy papules    Assessment & Plan     Skin tags, multiple acquired (40) bilateral neck  Epidermal / dermal shaving - bilateral neck  Informed consent: discussed and consent obtained   Timeout: patient name, date of birth, surgical site, and procedure verified   Anesthesia: the lesion was anesthetized in a standard fashion   Anesthesia comment:  Topical Licocaine applied 15 minutes prior to procedure Instrument used: scissors   Hemostasis achieved with: aluminum chloride and electrodesiccation   Outcome: patient tolerated procedure well   Post-procedure details: wound care instructions given    Area rinsed with cool sterile water. LaRoche Cicaplast B5 cream and a thin film of Vaseline applied. Ice pack given  Return if symptoms worsen or fail to improve.  Jaclynn Guarneri, CMA, am acting as scribe for [employer], DO.   Documentation: I have reviewed the above documentation for accuracy and completeness, and I agree with the above.  Langston Reusing, DO

## 2022-11-28 ENCOUNTER — Other Ambulatory Visit (HOSPITAL_COMMUNITY): Payer: Self-pay

## 2022-11-28 MED ORDER — PROGESTERONE MICRONIZED 100 MG PO CAPS
ORAL_CAPSULE | ORAL | 1 refills | Status: DC
  Filled 2022-11-28: qty 90, 90d supply, fill #0

## 2022-12-14 ENCOUNTER — Other Ambulatory Visit (HOSPITAL_COMMUNITY): Payer: Self-pay

## 2022-12-14 MED ORDER — DROSPIRENONE-ETHINYL ESTRADIOL 3-0.02 MG PO TABS
ORAL_TABLET | ORAL | 0 refills | Status: DC
  Filled 2022-12-14: qty 84, 84d supply, fill #0

## 2023-01-22 ENCOUNTER — Encounter (INDEPENDENT_AMBULATORY_CARE_PROVIDER_SITE_OTHER): Payer: 59 | Admitting: Ophthalmology

## 2023-01-22 DIAGNOSIS — H33302 Unspecified retinal break, left eye: Secondary | ICD-10-CM | POA: Diagnosis not present

## 2023-01-22 DIAGNOSIS — H33102 Unspecified retinoschisis, left eye: Secondary | ICD-10-CM | POA: Diagnosis not present

## 2023-01-22 DIAGNOSIS — H43813 Vitreous degeneration, bilateral: Secondary | ICD-10-CM

## 2023-02-01 ENCOUNTER — Encounter: Payer: Self-pay | Admitting: Physician Assistant

## 2023-02-01 DIAGNOSIS — R1010 Upper abdominal pain, unspecified: Secondary | ICD-10-CM

## 2023-02-01 DIAGNOSIS — R110 Nausea: Secondary | ICD-10-CM

## 2023-02-13 ENCOUNTER — Encounter: Payer: Self-pay | Admitting: Gastroenterology

## 2023-02-26 ENCOUNTER — Encounter: Payer: Self-pay | Admitting: Physician Assistant

## 2023-02-26 ENCOUNTER — Ambulatory Visit: Payer: 59 | Admitting: Physician Assistant

## 2023-02-26 VITALS — BP 118/78 | HR 88 | Ht 63.0 in | Wt 159.0 lb

## 2023-02-26 DIAGNOSIS — K644 Residual hemorrhoidal skin tags: Secondary | ICD-10-CM

## 2023-02-26 DIAGNOSIS — Z87898 Personal history of other specified conditions: Secondary | ICD-10-CM | POA: Diagnosis not present

## 2023-02-26 DIAGNOSIS — K588 Other irritable bowel syndrome: Secondary | ICD-10-CM | POA: Diagnosis not present

## 2023-02-26 DIAGNOSIS — R197 Diarrhea, unspecified: Secondary | ICD-10-CM

## 2023-02-26 NOTE — Progress Notes (Signed)
I agree with the assessment and plan as outlined by Ms. Esterwood. ?

## 2023-02-26 NOTE — Progress Notes (Signed)
Subjective:    Patient ID: Dorothy Fernandez, female    DOB: 02-Mar-1986, 37 y.o.   MRN: [id number]  HPI [name] is a pleasant 37 year old female, new to GI referred by Jarold Motto, PA-C for evaluation of upper abdominal pain and nausea/diarrhea.  Patient says that she initially had an episode of upper abdominal/epigastric pain and nausea about a year and a half ago.  This lasted for 12 to 24 hours, was associated with some diarrhea and then resolved.  She had had a couple of very minor episodes after that.  She was seen by primary care and at the time had been drinking a lot of caffeine, energy drinks etc. and it was thought she may have some gastritis.  She went off of caffeine and energy drinks and took a PPI for 4 to 6 weeks with complete resolution of symptoms.  She had felt well until August.  She says she had COVID-19 a couple of weeks prior to onset of the symptoms then developed epigastric discomfort, nausea and diarrhea.  She describes the upper abdominal discomfort as aching in nature.  She has had an occasional episode of vomiting.  She also had symptoms of bloating and gassiness with p.o. intake.,  No fever or chills, no melena or hematochezia.  At its worst she had 6-8 small-volume diarrheal stools per day. She started herself back on Protonix a couple of weeks ago.  Overall she is feeling better, the upper abdominal discomfort has resolved she is not having any nausea and is eating without difficulty.  She has no complaints of abdominal pain but continues to have some diarrhea.  She says she had 3 episodes yesterday.  Otherwise feeling back to normal. No regular aspirin or NSAIDs, no regular EtOH.  No recent antibiotics.  She did have an upper abdominal ultrasound done in May 2023, normal-appearing gallbladder no gallstones and CBD of 5.6 mm  She is otherwise in good health does have history of SVT and anxiety  Review of Systems Pertinent positive and negative review of systems were  noted in the above HPI section.  All other review of systems was otherwise negative.   Outpatient Encounter Medications as of 02/26/2023  Medication Sig   diphenhydrAMINE (BENADRYL) 12.5 MG chewable tablet Chew 12.5 mg by mouth 4 (four) times daily as needed for allergies.   Naltrexone HCl, Pain, (NALTREX) 1.5 MG CAPS Take by mouth.   pantoprazole (PROTONIX) 40 MG tablet Take 40 mg by mouth daily.   sertraline (ZOLOFT) 50 MG tablet Take 2 tablets (100 mg total) by mouth daily.   triamcinolone cream (KENALOG) 0.1 % Apply 1 application topically daily as needed.   Alpha-Lipoic Acid 100 MG CAPS Take 1 capsule by mouth daily in the afternoon. (Patient not taking: Reported on 02/26/2023)   cholecalciferol (VITAMIN D3) 25 MCG (1000 UNIT) tablet Take 1,000 Units by mouth daily. (Patient not taking: Reported on 02/26/2023)   Coenzyme Q10 (COQ-10) 100 MG CAPS Take 1 capsule by mouth daily in the afternoon. (Patient not taking: Reported on 02/26/2023)   Docosahexaenoic Acid (DHA ALGAL-900 PO) Take 1 capsule by mouth daily in the afternoon. (Patient not taking: Reported on 02/26/2023)   drospirenone-ethinyl estradiol (YAZ) 3-0.02 MG tablet Take 1 tablet by mouth at the same time each day, preferably either after the evening meal or at bedtime. (Patient not taking: Reported on 02/26/2023)   Levomefolate Glucosamine (METHYL-FOLATE) 1700 MCG CAPS Take 1 capsule by mouth daily in the afternoon. (Patient not taking: Reported  on 02/26/2023)   lidocaine (XYLOCAINE) 5 % ointment Apply to area being treated 1 hour prior to procedure (Patient not taking: Reported on 02/26/2023)   Methylcobalamin (METHYL B-12) 1000 MCG LOZG Place 1 tablet under the tongue daily in the afternoon. (Patient not taking: Reported on 02/26/2023)   Multiple Vitamins-Minerals (MULTIVIT/MULTIMINERAL ADULT PO) Take 1 tablet by mouth daily in the afternoon. (Patient not taking: Reported on 02/26/2023)   NADH-Ascorbic Acid-Sod Bicarb (ENADA NADH PO) Take 1 capsule by  mouth daily in the afternoon. (Patient not taking: Reported on 02/26/2023)   nitrofurantoin, macrocrystal-monohydrate, (MACROBID) 100 MG capsule Take 1 capsule (100 mg total) by mouth as needed. (Patient not taking: Reported on 02/26/2023)   progesterone (PROMETRIUM) 100 MG capsule take 1 capsule once a day in the evening taken cycle days 12-26 or as directed  for 90 Day(s), oral route (Patient not taking: Reported on 02/26/2023)   propranolol ER (INDERAL LA) 60 MG 24 hr capsule Take 1 capsule (60 mg total) by mouth daily. (Patient not taking: Reported on 02/26/2023)   [DISCONTINUED] doxycycline (VIBRA-TABS) 100 MG tablet Take 1 tablet (100 mg total) by mouth 2 (two) times daily.   [DISCONTINUED] prednisoLONE acetate (PRED FORTE) 1 % ophthalmic suspension Place 1 drop into the left eye 4 (four) times daily for 1 week, then stop.   No facility-administered encounter medications on file as of 02/26/2023.   Allergies  Allergen Reactions   Cephalosporins Rash   Erythromycin Rash   Penicillins Rash   Sulfa Antibiotics Rash   Ceclor [Cefaclor] Hives   Suprax [Cefixime] Hives   Bupropion Hives    Itching   Septra [Sulfamethoxazole-Trimethoprim] Rash   Patient Active Problem List   Diagnosis Date Noted   Thyroid nodule found 2023 - needs annual u/s x 5 years 06/30/2022   Paroxysmal SVT (supraventricular tachycardia) 09/13/2021   Anxiety 11/03/2019   Vegetarian diet 11/03/2019   Frequent UTI 11/03/2019   Uterine fibroids affecting pregnancy 06/15/2019   Lumbar herniated disc 05/15/2002   Social History   Socioeconomic History   Marital status: Married    Spouse name: Weston Brass   Number of children: 0   Years of education: Not on file   Highest education level: Doctorate  Occupational History   Occupation: Teacher, early years/pre  Tobacco Use   Smoking status: Never   Smokeless tobacco: Never  Vaping Use   Vaping status: Never Used  Substance and Sexual Activity   Alcohol use: Not Currently    Comment: 1 X  a year   Drug use: No   Sexual activity: Yes    Partners: Male    Birth control/protection: None  Other Topics Concern   Not on file  Social History Narrative   Pharmacist   Married   16 months -- son   Social Determinants of Health   Financial Resource Strain: Low Risk  (11/23/2017)   Overall Financial Resource Strain (CARDIA)    Difficulty of Paying Living Expenses: Not hard at all  Food Insecurity: No Food Insecurity (11/23/2017)   Hunger Vital Sign    Worried About Running Out of Food in the Last Year: Never true    Ran Out of Food in the Last Year: Never true  Transportation Needs: No Transportation Needs (11/23/2017)   PRAPARE - Administrator, Civil Service (Medical): No    Lack of Transportation (Non-Medical): No  Physical Activity: Insufficiently Active (11/23/2017)   Exercise Vital Sign    Days of Exercise per Week: 2 days  Minutes of Exercise per Session: 30 min  Stress: Stress Concern Present (11/23/2017)   [employer] of Occupational Health - Occupational Stress Questionnaire    Feeling of Stress : Very much  Social Connections: Moderately Integrated (11/06/2018)   Social Connection and Isolation Panel [NHANES]    Frequency of Communication with Friends and Family: Twice a week    Frequency of Social Gatherings with Friends and Family: Once a week    Attends Religious Services: 1 to 4 times per year    Active Member of [employer] or Organizations: No    Attends Banker Meetings: Never    Marital Status: Married  Catering manager Violence: Not At Risk (11/06/2018)   Humiliation, Afraid, Rape, and Kick questionnaire    Fear of Current or Ex-Partner: No    Emotionally Abused: No    Physically Abused: No    Sexually Abused: No    Ms. Bruck's family history includes Cancer in her maternal grandmother; Diabetes in her maternal grandfather; Hyperlipidemia in her mother.      Objective:    Vitals:   02/26/23 0852  BP: 118/78  Pulse: 88     Physical Exam Well-developed well-nourished WF  in no acute distress.  Height, Weight,159  BMI28.1  HEENT; nontraumatic normocephalic, EOMI, PE R LA, sclera anicteric. Oropharynx;not examined Neck; supple, no JVD Cardiovascular; regular rate and rhythm with S1-S2, no murmur rub or gallop Pulmonary; Clear bilaterally Abdomen; soft, nontender, nondistended, no palpable mass or hepatosplenomegaly, bowel sounds are active Rectal; small edematous external hemorrhoid, not thrombosed Skin; benign exam, no jaundice rash or appreciable lesions Extremities; no clubbing cyanosis or edema skin warm and dry Neuro/Psych; alert and oriented x4, grossly nonfocal mood and affect appropriate        Assessment & Plan:   #36 37 year old white female with 3-week history of epigastric aching discomfort, nausea occasional vomiting and diarrhea.  This occurred a week or so after she was diagnosed with COVID-19. She treated herself with Protonix 40 mg p.o. daily, and over the past few days she has not had any further upper GI symptoms, she is back to eating without difficulty, but has had some residual diarrhea with 3 loose stools yesterday.  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  She had an episode about a year and a half ago that was reminiscent of this though at the time was drinking a lot of caffeine and energy drinks and was thought perhaps to have had a gastritis.  She had resolution of symptoms with 1 month of Protonix. Upper abdominal ultrasound at that time unremarkable.  #2 external hemorrhoid-symptomatic but not painful, no bleeding likely exacerbated by recent diarrhea  #3 history of anxiety #4 history of PSVT  Plan; continue Protonix 40 mg p.o. every morning x 1 more week then stop Start a 1 month course of probiotic, suggested either Culturelle or Florastor Observe symptoms over the next couple of weeks, if diarrhea is persisting, I have asked her to call  back and speak to my nurse and at that time would do some labs and stool studies.  Hopefully symptoms will continue to improve and resolve.  Patient can follow-up with Dr. Leonides Schanz or myself on an as-needed basis.  Saydee Zolman S Taline Nass PA-C 02/26/2023   Cc: Jarold Motto, [place]

## 2023-02-26 NOTE — Patient Instructions (Signed)
_______________________________________________________  If your blood pressure at your visit was 140/90 or greater, please contact your primary care physician to follow up on this.  If you are age 36 or younger, your body mass index should be between 19-25. Your Body mass index is 28.17 kg/m. If this is out of the aformentioned range listed, please consider follow up with your Primary Care Provider.  ________________________________________________________  The [facility] providers would like to encourage you to use [facility] to communicate with providers for non-urgent requests or questions.  Due to long hold times on the telephone, sending your provider a message by [facility] may be a faster and more efficient way to get a response.  Please allow 48 business hours for a response.  Please remember that this is for non-urgent requests.  _______________________________________________________  Please purchase the following medications over the counter and take as directed:  You can use Recticare 3 to 4 times daily  FINISH: Protonix 40mg  every morning shortly before breakfast meal for 1 week.  You can try Culturelle or Florastor for 1 month.  Please call back in 2 to 3 weeks if symptoms are persisting and ask to speak with nurse.  Thank you for entrusting me with your care and choosing [facility]  Amy Esterwood, PA-C

## 2023-03-01 ENCOUNTER — Other Ambulatory Visit (HOSPITAL_COMMUNITY): Payer: Self-pay

## 2023-03-01 MED ORDER — DROSPIRENONE-ETHINYL ESTRADIOL 3-0.02 MG PO TABS
1.0000 | ORAL_TABLET | Freq: Every evening | ORAL | 0 refills | Status: DC
  Filled 2023-03-01: qty 84, 84d supply, fill #0

## 2023-03-21 ENCOUNTER — Other Ambulatory Visit (HOSPITAL_COMMUNITY): Payer: Self-pay

## 2023-04-23 ENCOUNTER — Other Ambulatory Visit (HOSPITAL_COMMUNITY): Payer: Self-pay

## 2023-04-23 ENCOUNTER — Other Ambulatory Visit: Payer: Self-pay | Admitting: Physician Assistant

## 2023-04-23 MED ORDER — SERTRALINE HCL 50 MG PO TABS
100.0000 mg | ORAL_TABLET | Freq: Every day | ORAL | 0 refills | Status: DC
  Filled 2023-04-23: qty 180, 90d supply, fill #0

## 2023-04-26 ENCOUNTER — Other Ambulatory Visit (HOSPITAL_COMMUNITY): Payer: Self-pay

## 2023-07-11 ENCOUNTER — Encounter: Payer: Self-pay | Admitting: Physician Assistant

## 2023-07-11 ENCOUNTER — Other Ambulatory Visit: Payer: Self-pay | Admitting: Physician Assistant

## 2023-07-11 ENCOUNTER — Ambulatory Visit: Payer: 59 | Admitting: Physician Assistant

## 2023-07-11 VITALS — BP 132/80 | HR 84 | Temp 97.2°F | Ht 63.0 in | Wt 166.5 lb

## 2023-07-11 DIAGNOSIS — E010 Iodine-deficiency related diffuse (endemic) goiter: Secondary | ICD-10-CM | POA: Diagnosis not present

## 2023-07-11 DIAGNOSIS — E663 Overweight: Secondary | ICD-10-CM | POA: Diagnosis not present

## 2023-07-11 DIAGNOSIS — Z0001 Encounter for general adult medical examination with abnormal findings: Secondary | ICD-10-CM | POA: Diagnosis not present

## 2023-07-11 DIAGNOSIS — F419 Anxiety disorder, unspecified: Secondary | ICD-10-CM

## 2023-07-11 DIAGNOSIS — I471 Supraventricular tachycardia, unspecified: Secondary | ICD-10-CM | POA: Diagnosis not present

## 2023-07-11 DIAGNOSIS — Z13220 Encounter for screening for lipoid disorders: Secondary | ICD-10-CM | POA: Diagnosis not present

## 2023-07-11 DIAGNOSIS — F32A Depression, unspecified: Secondary | ICD-10-CM

## 2023-07-11 DIAGNOSIS — Z0000 Encounter for general adult medical examination without abnormal findings: Secondary | ICD-10-CM

## 2023-07-11 DIAGNOSIS — R2241 Localized swelling, mass and lump, right lower limb: Secondary | ICD-10-CM | POA: Diagnosis not present

## 2023-07-11 DIAGNOSIS — R7989 Other specified abnormal findings of blood chemistry: Secondary | ICD-10-CM

## 2023-07-11 LAB — COMPREHENSIVE METABOLIC PANEL
ALT: 62 U/L — ABNORMAL HIGH (ref 0–35)
AST: 38 U/L — ABNORMAL HIGH (ref 0–37)
Albumin: 4.6 g/dL (ref 3.5–5.2)
Alkaline Phosphatase: 60 U/L (ref 39–117)
BUN: 12 mg/dL (ref 6–23)
CO2: 26 meq/L (ref 19–32)
Calcium: 9.4 mg/dL (ref 8.4–10.5)
Chloride: 105 meq/L (ref 96–112)
Creatinine, Ser: 0.86 mg/dL (ref 0.40–1.20)
GFR: 86.16 mL/min (ref >60.00)
Glucose, Bld: 103 mg/dL — ABNORMAL HIGH (ref 70–99)
Potassium: 3.9 meq/L (ref 3.5–5.1)
Sodium: 139 meq/L (ref 135–145)
Total Bilirubin: 0.6 mg/dL (ref 0.2–1.2)
Total Protein: 7 g/dL (ref 6.0–8.3)

## 2023-07-11 LAB — LIPID PANEL
Cholesterol: 180 mg/dL (ref 0–200)
HDL: 47.6 mg/dL (ref >39.00)
LDL Cholesterol: 104 mg/dL — ABNORMAL HIGH (ref 0–99)
NonHDL: 132.79
Total CHOL/HDL Ratio: 4
Triglycerides: 142 mg/dL (ref 0.0–149.0)
VLDL: 28.4 mg/dL (ref 0.0–40.0)

## 2023-07-11 LAB — CBC WITH DIFFERENTIAL/PLATELET
Basophils Absolute: 0.1 10*3/uL (ref 0.0–0.1)
Basophils Relative: 1 % (ref 0.0–3.0)
Eosinophils Absolute: 0.3 10*3/uL (ref 0.0–0.7)
Eosinophils Relative: 3.5 % (ref 0.0–5.0)
HCT: 41.3 % (ref 36.0–46.0)
Hemoglobin: 13.7 g/dL (ref 12.0–15.0)
Lymphocytes Relative: 36.1 % (ref 12.0–46.0)
Lymphs Abs: 2.6 10*3/uL (ref 0.7–4.0)
MCHC: 33.1 g/dL (ref 30.0–36.0)
MCV: 88.7 fL (ref 78.0–100.0)
Monocytes Absolute: 0.5 10*3/uL (ref 0.1–1.0)
Monocytes Relative: 7.2 % (ref 3.0–12.0)
Neutro Abs: 3.7 10*3/uL (ref 1.4–7.7)
Neutrophils Relative %: 52.2 % (ref 43.0–77.0)
Platelets: 258 10*3/uL (ref 150.0–400.0)
RBC: 4.66 Mil/uL (ref 3.87–5.11)
RDW: 13.8 % (ref 11.5–15.5)
WBC: 7.1 10*3/uL (ref 4.0–10.5)

## 2023-07-11 NOTE — Patient Instructions (Addendum)
It was great to see you!  A referral has been placed for you to see one of our fantastic providers at [facility]. Someone from their office will be in touch soon regarding scheduling your appointment.  Their location:  [facility] [place]  [address] on the 1st floor Phone number [phone] Fax [phone].   This location is across the street from the entrance to [facility] and in the same complex as the [facility]  Please go to the lab for blood work.   Our office will call you with your results unless you have chosen to receive results via MyChart.  If your blood work is normal we will follow-up each year for physicals and as scheduled for chronic medical problems.  If anything is abnormal we will treat accordingly and get you in for a follow-up.  Take care,  Refugia Laneve   

## 2023-07-11 NOTE — Progress Notes (Signed)
Subjective:    Dorothy Fernandez is a 38 y.o. female and is here for a comprehensive physical exam.  HPI  Health Maintenance Due  Topic Date Due   Hepatitis C Screening  Never done    Acute Concerns: Foot Edema  She complains of swelling located on the instep of her right foot.  Her symptoms started about 2 months ago and are worsened by standing or sitting for a long period of time. She can't seem to identify any prior injuries that contributes to her symptoms. She has used compression socks at night  She denies any accompanying pain, calf pain of swelling, or tingling.  Chronic Issues: Anxiety/Depression  She reports compliance and good tolerance of Zoloft 100 mg daily. No complains or symptoms at this time.  Hx of SVT Reports compliance and good tolerance of propanolol ER 60 mg daily. No symptoms or concerns at this time.   Vitamin/ Supplements  She has been taking a daily multivitamin.  She reports taking iron supplement (slow absorbing) to avoid abdominal/ constipation issues.  Denies taking vitamin B12 injections are her levels are typically on the higher range. Therefore, she manages her levels with her multivitamin.   Health Maintenance: Immunizations -- N/A Colonoscopy -- N/A Mammogram -- N/A PAP -- Last done on 07-08-18 Bone Density -- N/A Diet -- follows a vegan diet. Exercise -- minimal exercise levels  Sleep habits -- no complains Mood -- stable   UTD with dentist? - yes UTD with eye doctor? - yes  Weight history: Wt Readings from Last 10 Encounters:  07/11/23 166 lb 8 oz (75.5 kg)  02/26/23 159 lb (72.1 kg)  08/17/22 155 lb 4 oz (70.4 kg)  06/08/22 153 lb 6.1 oz (69.6 kg)  11/15/21 149 lb (67.6 kg)  09/13/21 146 lb 6.4 oz (66.4 kg)  05/02/21 141 lb (64 kg)  12/21/20 139 lb 6.4 oz (63.2 kg)  09/27/20 158 lb 9 oz (71.9 kg)  11/03/19 130 lb 8 oz (59.2 kg)   Body mass index is 29.49 kg/m. Patient's last menstrual period was 06/30/2023 (exact  date).  Alcohol use:  reports that she does not currently use alcohol.  Tobacco use:  Tobacco Use: Low Risk  (07/11/2023)   Patient History    Smoking Tobacco Use: Never    Smokeless Tobacco Use: Never    Passive Exposure: Not on file   Eligible for lung cancer screening? no     07/11/2023    8:21 AM  Depression screen PHQ 2/9  Decreased Interest 1  Down, Depressed, Hopeless 1  PHQ - 2 Score 2  Altered sleeping 1  Tired, decreased energy 3  Change in appetite 1  Feeling bad or failure about yourself  1  Trouble concentrating 0  Moving slowly or fidgety/restless 0  Suicidal thoughts 0  PHQ-9 Score 8  Difficult doing work/chores Somewhat difficult     Other providers/specialists: Patient Care Team: Jarold Motto, [place] as PCP - General (Physician Assistant)    PMHx, SurgHx, SocialHx, Medications, and Allergies were reviewed in the Visit Navigator and updated as appropriate.   Past Medical History:  Diagnosis Date   Adenomyosis of uterus 08/2022   Allergy    Anxiety    Depression    Dysrhythmia    propanolol for SVT   Fibroid    Kidney stone    Nephrolithiasis    Tachycardia    UTI (urinary tract infection)      Past Surgical History:  Procedure Laterality  Date   EYE SURGERY  08/2022   Retinal tear repair   TONSILLECTOMY       Family History  Problem Relation Age of Onset   Hyperlipidemia Mother    Varicose Veins Mother    Depression Father    Early death Father    Cancer Maternal Grandmother        Cervical   Diabetes Maternal Grandfather    Alcohol abuse Maternal Grandfather    Alcohol abuse Paternal Grandfather    Colon cancer Neg Hx    Breast cancer Neg Hx    Liver disease Neg Hx     Social History   Tobacco Use   Smoking status: Never   Smokeless tobacco: Never  Vaping Use   Vaping status: Never Used  Substance Use Topics   Alcohol use: Not Currently    Comment: 1 X a year   Drug use: No    Review of Systems:   Review of  Systems  Constitutional:  Negative for chills, fever, malaise/fatigue and weight loss.  HENT:  Negative for hearing loss, sinus pain and sore throat.   Respiratory:  Negative for cough and hemoptysis.   Cardiovascular:  Positive for leg swelling (right foot). Negative for chest pain, palpitations and PND.  Gastrointestinal:  Negative for abdominal pain, constipation, diarrhea, heartburn, nausea and vomiting.  Genitourinary:  Negative for dysuria, frequency and urgency.  Musculoskeletal:  Negative for back pain, myalgias and neck pain.  Skin:  Negative for itching and rash.  Neurological:  Negative for dizziness, tingling, tremors, seizures and headaches.  Endo/Heme/Allergies:  Negative for polydipsia.  Psychiatric/Behavioral:  Negative for depression. The patient is not nervous/anxious.     Objective:   BP 132/80 (BP Location: Left Arm, Patient Position: Sitting, Cuff Size: Normal)   Pulse 84   Temp (!) 97.2 F (36.2 C) (Temporal)   Ht 5\' 3"  (1.6 m)   Wt 166 lb 8 oz (75.5 kg)   LMP 06/30/2023 (Exact Date)   SpO2 97%   BMI 29.49 kg/m  Body mass index is 29.49 kg/m.   General Appearance:    Alert, cooperative, no distress, appears stated age  Head:    Normocephalic, without obvious abnormality, atraumatic  Eyes:    PERRL, conjunctiva/corneas clear, EOM's intact, fundi    benign, both eyes  Ears:    Normal TM's and external ear canals, both ears  Nose:   Nares normal, septum midline, mucosa normal, no drainage    or sinus tenderness  Throat:   Lips, mucosa, and tongue normal; teeth and gums normal  Neck:   Supple, symmetrical, trachea midline, no adenopathy;    thyroid:  no enlargement/tenderness/nodules; no carotid   bruit or JVD  Back:     Symmetric, no curvature, ROM normal, no CVA tenderness  Lungs:     Clear to auscultation bilaterally, respirations unlabored  Chest Wall:    No tenderness or deformity   Heart:    Regular rate and rhythm, S1 and S2 normal, no murmur, rub  or gallop  Breast Exam:    Deferred  Abdomen:     Soft, non-tender, bowel sounds active all four quadrants,    no masses, no organomegaly  Genitalia:    Deferred   Extremities:   Extremities normal, atraumatic, no cyanosis  Right foot with lateral dorsal swelling; no pitting edema - normal range of motion; no tenderness to palpation   Pulses:   2+ and symmetric all extremities  Skin:   Skin color,  texture, turgor normal, no rashes or lesions  Lymph nodes:   Cervical, supraclavicular, and axillary nodes normal  Neurologic:   CNII-XII intact, normal strength, sensation and reflexes    throughout    Assessment/Plan:   Routine physical examination Today patient counseled on age appropriate routine health concerns for screening and prevention, each reviewed and up to date or declined. Immunizations reviewed and up to date or declined. Labs ordered and reviewed. Risk factors for depression reviewed and negative. Hearing function and visual acuity are intact. ADLs screened and addressed as needed. Functional ability and level of safety reviewed and appropriate. Education, counseling and referrals performed based on assessed risks today. Patient provided with a copy of personalized plan for preventive services.  Thyromegaly Repeat ultrasound annually  Localized swelling of right foot Unclear etiology Referral to sports medicine  Paroxysmal SVT (supraventricular tachycardia) (HCC) Well controlled Continue propranolol 60 mg ER  Follow up yearly, sooner if concerns  Overweight Continue efforts at healthy lifestyle  Anxiety and Depression Well controlled Continue Zoloft 100 mg daily Follow-up in 1 year, sooner if concerns    Jarold Motto, PA-C [facility] M Kadhim,acting as a Neurosurgeon for [employer], PA.,have documented all relevant documentation on the behalf of Jarold Motto, PA,as directed by  Jarold Motto, PA while in the presence of Jarold Motto, [place].   I, Jarold Motto, [place], have reviewed all documentation for this visit. The documentation on 07/11/23 for the exam, diagnosis, procedures, and orders are all accurate and complete.

## 2023-07-13 NOTE — Progress Notes (Unsigned)
    Aleen Sells D.[facility] [address] [place] [address] Phone: [phone]   Assessment and Plan:     There are no diagnoses linked to this encounter.  ***   Pertinent previous records reviewed include ***    Follow Up: ***     Subjective:   I, Dorothy Fernandez, am serving as a Neurosurgeon for Doctor Richardean Sale  Chief Complaint: right foot swelling   HPI:   07/16/2023 Patient is a 38 year old female with right foot swelling. Patient states   Relevant Historical Information: ***  Additional pertinent review of systems negative.   Current Outpatient Medications:    cholecalciferol (VITAMIN D3) 25 MCG (1000 UNIT) tablet, Take 1,000 Units by mouth daily., Disp: , Rfl:    Coenzyme Q10 (COQ-10) 100 MG CAPS, Take 1 capsule by mouth daily in the afternoon., Disp: , Rfl:    CREATINE MONOHYDRATE PO, Take 1 Scoop by mouth daily in the afternoon. 5 gram scoop, Disp: , Rfl:    diphenhydrAMINE (BENADRYL) 12.5 MG chewable tablet, Chew 12.5 mg by mouth 4 (four) times daily as needed for allergies., Disp: , Rfl:    Docosahexaenoic Acid (DHA ALGAL-900 PO), Take 1 capsule by mouth daily in the afternoon., Disp: , Rfl:    Ferrous Sulfate Dried (SLOW RELEASE IRON) 45 MG TBCR, Take 1 tablet by mouth daily in the afternoon., Disp: , Rfl:    Multiple Vitamins-Minerals (MULTIVIT/MULTIMINERAL ADULT PO), Take 1 tablet by mouth daily in the afternoon., Disp: , Rfl:    NADH-Ascorbic Acid-Sod Bicarb (ENADA NADH PO), Take 1 capsule by mouth daily in the afternoon., Disp: , Rfl:    nitrofurantoin, macrocrystal-monohydrate, (MACROBID) 100 MG capsule, Take 1 capsule (100 mg total) by mouth as needed., Disp: 90 capsule, Rfl: 0   propranolol ER (INDERAL LA) 60 MG 24 hr capsule, Take 1 capsule (60 mg total) by mouth daily., Disp: 90 capsule, Rfl: 3   sertraline (ZOLOFT) 50 MG tablet, Take 2 tablets (100 mg total) by mouth daily., Disp: 180 tablet, Rfl: 0    triamcinolone cream (KENALOG) 0.1 %, Apply 1 application topically daily as needed., Disp: , Rfl:    Objective:     There were no vitals filed for this visit.    There is no height or weight on file to calculate BMI.    Physical Exam:    ***   Electronically signed by:  Aleen Sells [facility] 7:36 AM 07/13/23

## 2023-07-16 ENCOUNTER — Ambulatory Visit (INDEPENDENT_AMBULATORY_CARE_PROVIDER_SITE_OTHER): Payer: 59 | Admitting: Sports Medicine

## 2023-07-16 ENCOUNTER — Other Ambulatory Visit (HOSPITAL_COMMUNITY): Payer: Self-pay

## 2023-07-16 ENCOUNTER — Ambulatory Visit (INDEPENDENT_AMBULATORY_CARE_PROVIDER_SITE_OTHER): Payer: 59

## 2023-07-16 VITALS — BP 120/82 | HR 96 | Ht 63.0 in | Wt 166.0 lb

## 2023-07-16 DIAGNOSIS — M7731 Calcaneal spur, right foot: Secondary | ICD-10-CM | POA: Diagnosis not present

## 2023-07-16 DIAGNOSIS — M7989 Other specified soft tissue disorders: Secondary | ICD-10-CM | POA: Diagnosis not present

## 2023-07-16 DIAGNOSIS — R2241 Localized swelling, mass and lump, right lower limb: Secondary | ICD-10-CM | POA: Diagnosis not present

## 2023-07-16 DIAGNOSIS — M79671 Pain in right foot: Secondary | ICD-10-CM | POA: Diagnosis not present

## 2023-07-16 MED ORDER — MELOXICAM 15 MG PO TABS
15.0000 mg | ORAL_TABLET | Freq: Every day | ORAL | 0 refills | Status: DC
  Filled 2023-07-16: qty 30, 30d supply, fill #0

## 2023-07-16 NOTE — Patient Instructions (Signed)
-   Start meloxicam 15 mg daily x2 weeks.  If still having pain after 2 weeks, complete 3rd-week of NSAID. May use remaining NSAID as needed once daily for pain control.  Do not to use additional over-the-counter NSAIDs (ibuprofen, naproxen, Advil, Aleve) while taking prescription NSAIDs.  May use Tylenol [phone] mg 2 to 3 times a day for breakthrough pain. Recommend wearing a supportive athletic shoe May try compression stockings As needed follow up if no improvement 4 week follow up

## 2023-07-17 ENCOUNTER — Ambulatory Visit
Admission: RE | Admit: 2023-07-17 | Discharge: 2023-07-17 | Disposition: A | Discharge status: Home / Self Care | Payer: 59 | Source: Ambulatory Visit | Attending: Physician Assistant | Admitting: Physician Assistant

## 2023-07-17 DIAGNOSIS — E041 Nontoxic single thyroid nodule: Secondary | ICD-10-CM | POA: Diagnosis not present

## 2023-07-17 DIAGNOSIS — E010 Iodine-deficiency related diffuse (endemic) goiter: Secondary | ICD-10-CM

## 2023-07-22 ENCOUNTER — Encounter: Payer: Self-pay | Admitting: Physician Assistant

## 2023-07-22 ENCOUNTER — Other Ambulatory Visit: Payer: Self-pay | Admitting: Physician Assistant

## 2023-07-23 ENCOUNTER — Other Ambulatory Visit (HOSPITAL_COMMUNITY): Payer: Self-pay

## 2023-07-23 MED ORDER — SERTRALINE HCL 50 MG PO TABS
100.0000 mg | ORAL_TABLET | Freq: Every day | ORAL | 1 refills | Status: DC
  Filled 2023-07-23: qty 180, 90d supply, fill #0
  Filled 2023-10-22: qty 180, 90d supply, fill #1

## 2023-07-24 ENCOUNTER — Encounter: Payer: Self-pay | Admitting: Sports Medicine

## 2023-07-30 ENCOUNTER — Other Ambulatory Visit (INDEPENDENT_AMBULATORY_CARE_PROVIDER_SITE_OTHER): Payer: 59

## 2023-07-30 DIAGNOSIS — R7989 Other specified abnormal findings of blood chemistry: Secondary | ICD-10-CM

## 2023-07-31 ENCOUNTER — Encounter: Payer: Self-pay | Admitting: Physician Assistant

## 2023-07-31 LAB — HEPATIC FUNCTION PANEL
ALT: 28 U/L (ref 0–35)
AST: 19 U/L (ref 0–37)
Albumin: 4.1 g/dL (ref 3.5–5.2)
Alkaline Phosphatase: 71 U/L (ref 39–117)
Bilirubin, Direct: 0.1 mg/dL (ref 0.0–0.3)
Total Bilirubin: 0.3 mg/dL (ref 0.2–1.2)
Total Protein: 6.6 g/dL (ref 6.0–8.3)

## 2023-08-10 IMAGING — US US ABDOMEN LIMITED
1 series · 14 of 25 positions shown · non-contrast
Comparison: None Available.

CLINICAL DATA: Right upper quadrant pain for 4 months

EXAM:
ULTRASOUND ABDOMEN LIMITED RIGHT UPPER QUADRANT

[Series 1: us abdomen limited ruq (liver/gb) · 14 of 76 slices shown]
[im 1/76]
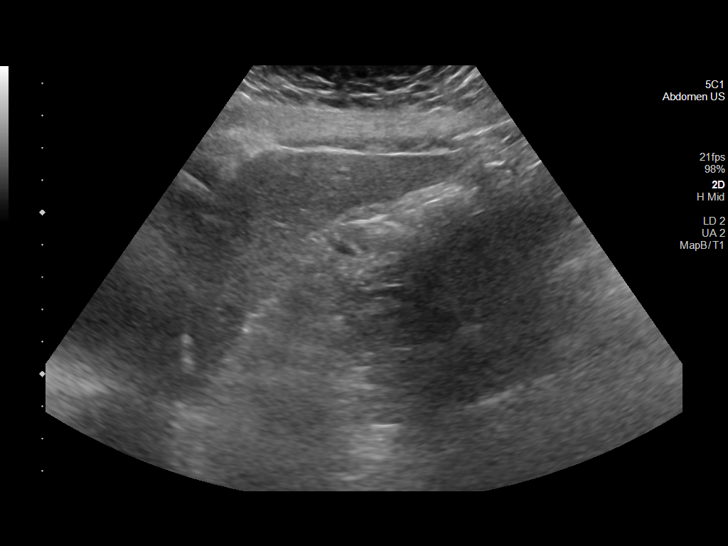
[im 7/76]
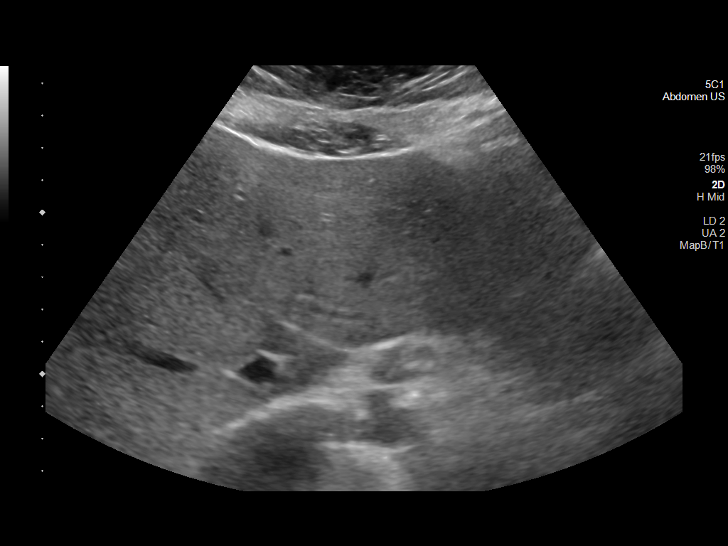
[im 13/76]
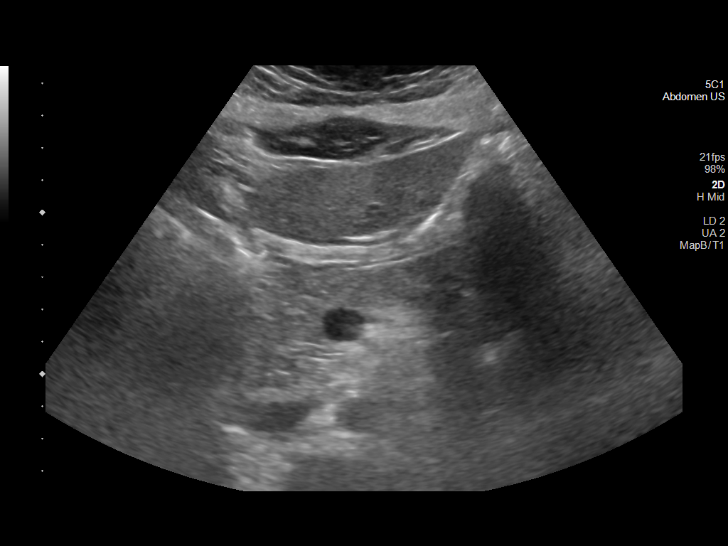
[im 19/76]
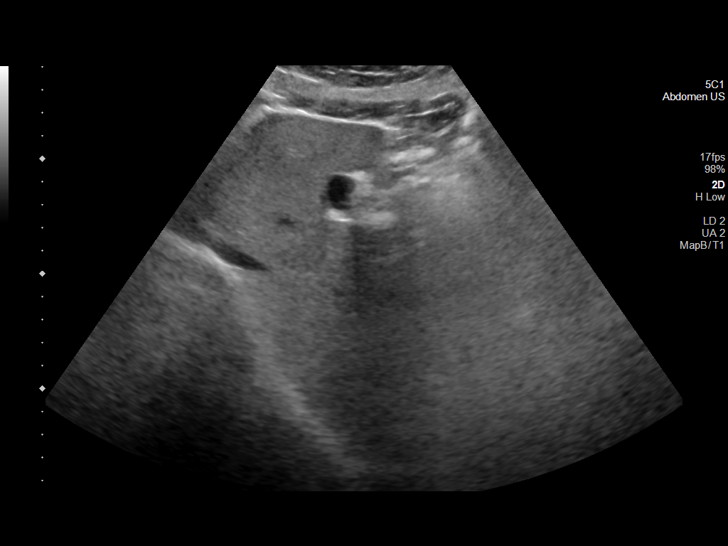
[im 26/76]
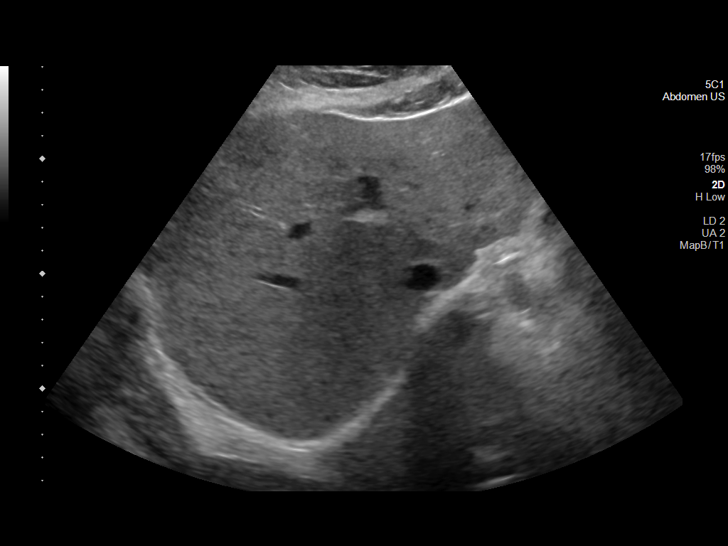
[im 29/76]
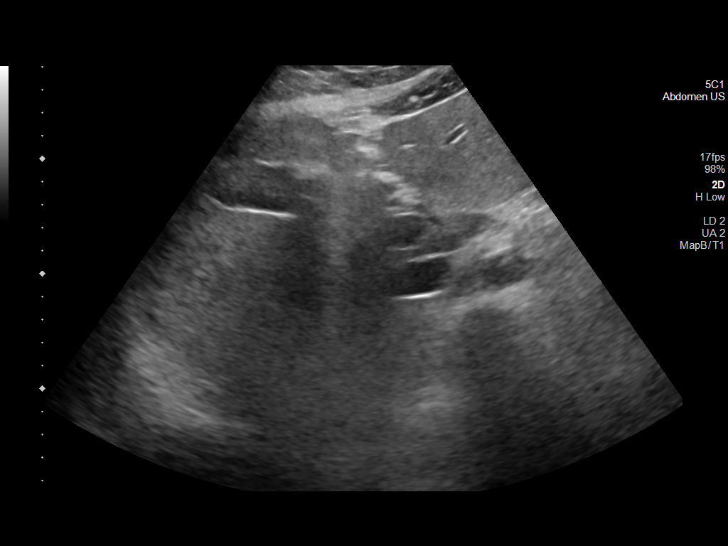
[im 35/76]
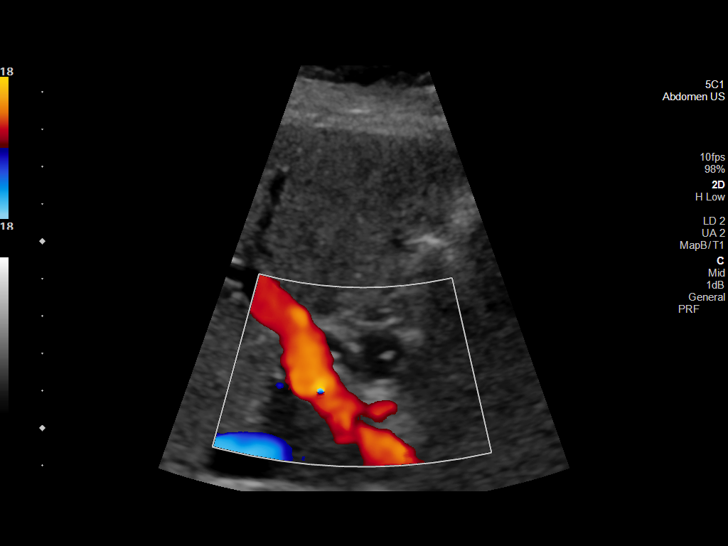
[im 41/76]
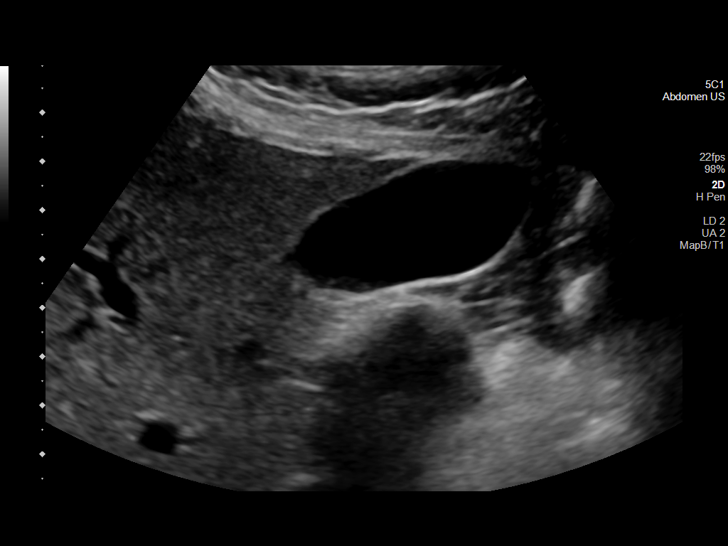
[im 47/76]
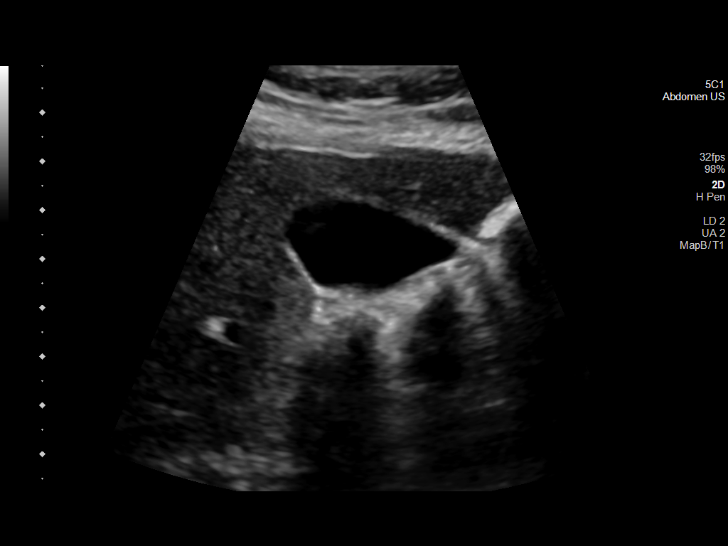
[im 51/76]
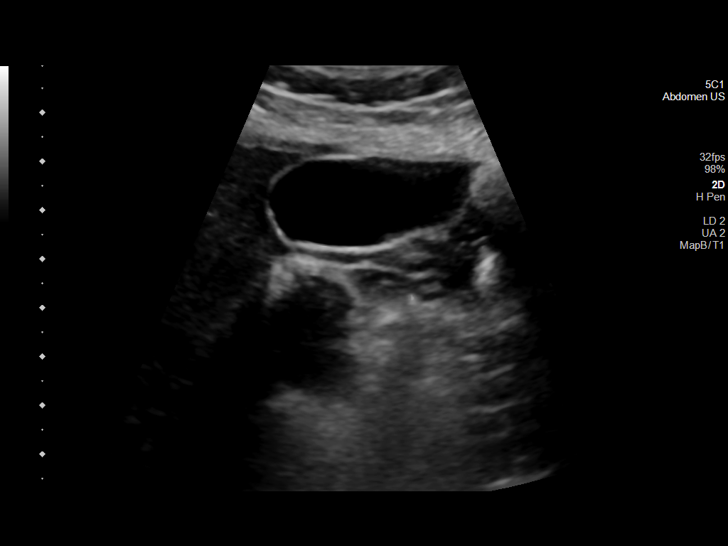
[im 57/76]
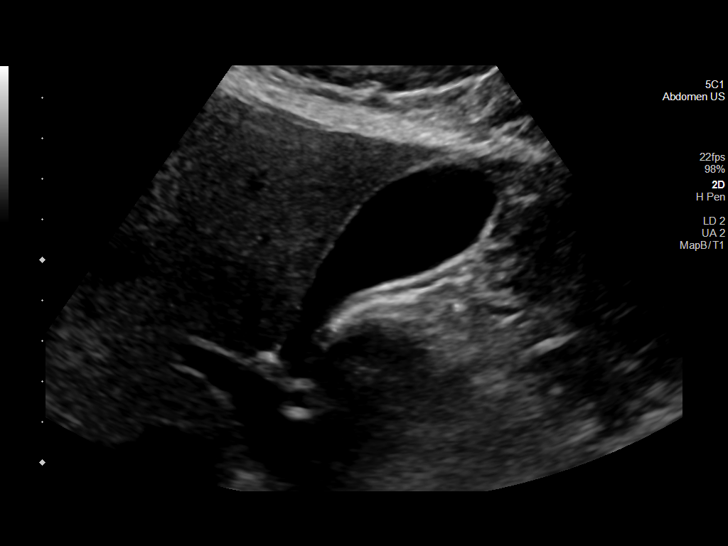
[im 63/76]
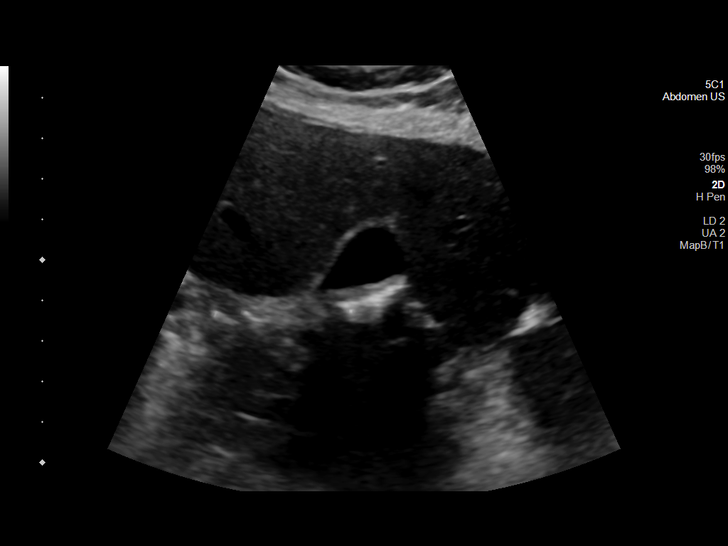
[im 69/76]
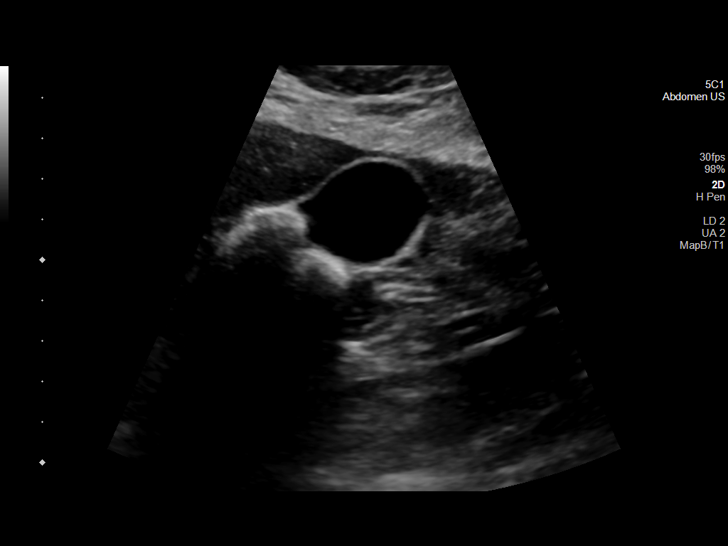
[im 76/76]
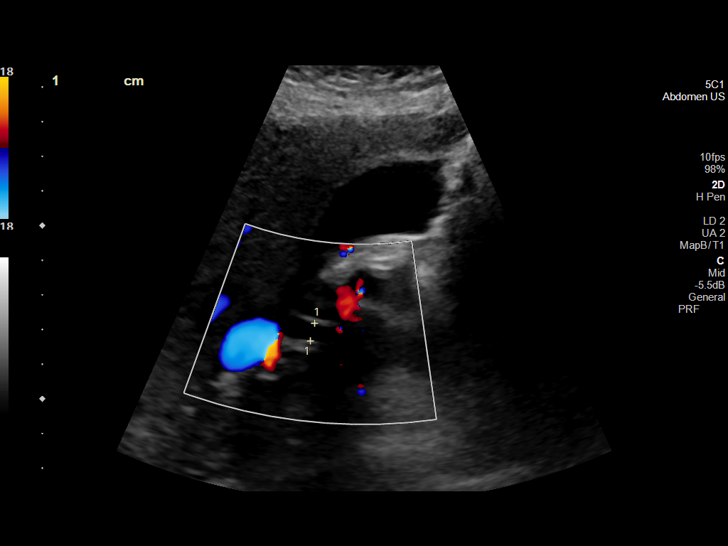

[14 of 25 positions shown; findings below may reference images not displayed]

FINDINGS: Gallbladder:

No gallstones or wall thickening visualized. No sonographic Murphy
sign noted by sonographer.

Common bile duct:

Diameter: 5.6 mm

Liver:

No focal lesion identified. Within normal limits in parenchymal
echogenicity. Portal vein is patent on color Doppler imaging with
normal direction of blood flow towards the liver.

Other: None.
IMPRESSION: Normal study.  No cause for right upper quadrant pain identified.

## 2023-09-11 ENCOUNTER — Other Ambulatory Visit (HOSPITAL_COMMUNITY): Payer: Self-pay

## 2023-09-11 ENCOUNTER — Other Ambulatory Visit: Payer: Self-pay | Admitting: Physician Assistant

## 2023-09-11 MED ORDER — PROPRANOLOL HCL ER 60 MG PO CP24
60.0000 mg | ORAL_CAPSULE | Freq: Every day | ORAL | 3 refills | Status: AC
  Filled 2023-09-11: qty 90, 90d supply, fill #0
  Filled 2023-12-13: qty 90, 90d supply, fill #1
  Filled 2024-03-13: qty 90, 90d supply, fill #2
  Filled 2024-06-13: qty 90, 90d supply, fill #3

## 2023-10-17 ENCOUNTER — Telehealth: Admitting: Physician Assistant

## 2023-10-17 ENCOUNTER — Encounter: Payer: Self-pay | Admitting: Physician Assistant

## 2023-10-17 ENCOUNTER — Other Ambulatory Visit (HOSPITAL_COMMUNITY): Payer: Self-pay

## 2023-10-17 DIAGNOSIS — B9689 Other specified bacterial agents as the cause of diseases classified elsewhere: Secondary | ICD-10-CM

## 2023-10-17 DIAGNOSIS — J019 Acute sinusitis, unspecified: Secondary | ICD-10-CM | POA: Diagnosis not present

## 2023-10-17 MED ORDER — DOXYCYCLINE HYCLATE 100 MG PO TABS
100.0000 mg | ORAL_TABLET | Freq: Two times a day (BID) | ORAL | 0 refills | Status: DC
  Filled 2023-10-17: qty 20, 10d supply, fill #0

## 2023-10-17 NOTE — Progress Notes (Signed)
 I have spent 5 minutes in review of e-visit questionnaire, review and updating patient chart, medical decision making and response to patient.   Piedad Climes, PA-C

## 2023-10-17 NOTE — Progress Notes (Signed)

## 2023-10-25 ENCOUNTER — Encounter: Payer: Self-pay | Admitting: Dermatology

## 2023-10-25 ENCOUNTER — Ambulatory Visit: Payer: 59 | Admitting: Dermatology

## 2023-10-25 VITALS — BP 109/66 | HR 90

## 2023-10-25 DIAGNOSIS — L814 Other melanin hyperpigmentation: Secondary | ICD-10-CM

## 2023-10-25 DIAGNOSIS — Z1283 Encounter for screening for malignant neoplasm of skin: Secondary | ICD-10-CM

## 2023-10-25 DIAGNOSIS — D1801 Hemangioma of skin and subcutaneous tissue: Secondary | ICD-10-CM

## 2023-10-25 DIAGNOSIS — W908XXA Exposure to other nonionizing radiation, initial encounter: Secondary | ICD-10-CM | POA: Diagnosis not present

## 2023-10-25 DIAGNOSIS — D229 Melanocytic nevi, unspecified: Secondary | ICD-10-CM

## 2023-10-25 DIAGNOSIS — L578 Other skin changes due to chronic exposure to nonionizing radiation: Secondary | ICD-10-CM | POA: Diagnosis not present

## 2023-10-25 DIAGNOSIS — L821 Other seborrheic keratosis: Secondary | ICD-10-CM

## 2023-10-25 NOTE — Patient Instructions (Signed)

## 2023-10-25 NOTE — Progress Notes (Signed)
   Total Body Skin Exam (TBSE) Visit   Subjective  Dorothy Fernandez is a 38 y.o. female who presents for the following: Skin Cancer Screening and Full Body Skin Exam  Patient presents today for follow up visit for TBSE. Patient was last evaluated on 11/21/2022 . Patient denies medication changes. Patient reports she does not have spots, moles and lesions of concern to be evaluated. Patient reports throughout her lifetime she has had moderate sun exposure. Currently, patient reports if she has excessive sun exposure, she does apply sunscreen and/or wears protective coverings. Patient reports she does not have hx of bx. Patient denies  family history of skin cancers. The patient has spots, moles and lesions to be evaluated, some may be new or changing and the patient has concerns that these could be cancer.  The following portions of the chart were reviewed this encounter and updated as appropriate: medications, allergies, medical history  Review of Systems:  No other skin or systemic complaints except as noted in HPI or Assessment and Plan.  Objective  Well appearing patient in no apparent distress; mood and affect are within normal limits.  A full examination was performed including scalp, head, eyes, ears, nose, lips, neck, chest, axillae, abdomen, back, buttocks, bilateral upper extremities, bilateral lower extremities, hands, feet, fingers, toes, fingernails, and toenails. All findings within normal limits unless otherwise noted below.   Relevant physical exam findings are noted in the Assessment and Plan.   Assessment & Plan   LENTIGINES, SEBORRHEIC KERATOSES, HEMANGIOMAS - Benign normal skin lesions - Benign-appearing - Call for any changes  MELANOCYTIC NEVI - Tan-brown and/or pink-flesh-colored symmetric macules and papules - Benign appearing on exam today - Observation - Call clinic for new or changing moles - Recommend daily use of broad spectrum spf 30+ sunscreen to sun-exposed  areas.   ACTINIC DAMAGE - Chronic condition, secondary to cumulative UV/sun exposure - diffuse scaly erythematous macules with underlying dyspigmentation - Recommend daily broad spectrum sunscreen SPF 30+ to sun-exposed areas, reapply every 2 hours as needed.  - Staying in the shade or wearing long sleeves, sun glasses (UVA+UVB protection) and wide brim hats (4-inch brim around the entire circumference of the hat) are also recommended for sun protection.  - Call for new or changing lesions.  SKIN CANCER SCREENING PERFORMED TODAY.    Return in about 1 year (around 10/24/2024) for TBSE.  Exie Holler, CMA, am acting as scribe for [employer], DO.   Documentation: I have reviewed the above documentation for accuracy and completeness, and I agree with the above.  Louana Roup, DO

## 2023-12-10 ENCOUNTER — Emergency Department (HOSPITAL_COMMUNITY)

## 2023-12-10 ENCOUNTER — Emergency Department (HOSPITAL_COMMUNITY)
Admission: EM | Admit: 2023-12-10 | Discharge: 2023-12-10 | Disposition: A | Discharge status: Home / Self Care | Attending: Emergency Medicine | Admitting: Emergency Medicine

## 2023-12-10 ENCOUNTER — Encounter (HOSPITAL_COMMUNITY): Payer: Self-pay | Admitting: *Deleted

## 2023-12-10 ENCOUNTER — Other Ambulatory Visit: Payer: Self-pay

## 2023-12-10 DIAGNOSIS — H9202 Otalgia, left ear: Secondary | ICD-10-CM | POA: Diagnosis not present

## 2023-12-10 DIAGNOSIS — R519 Headache, unspecified: Secondary | ICD-10-CM | POA: Insufficient documentation

## 2023-12-10 DIAGNOSIS — S50811A Abrasion of right forearm, initial encounter: Secondary | ICD-10-CM | POA: Diagnosis not present

## 2023-12-10 DIAGNOSIS — Y92410 Unspecified street and highway as the place of occurrence of the external cause: Secondary | ICD-10-CM | POA: Insufficient documentation

## 2023-12-10 DIAGNOSIS — S60511A Abrasion of right hand, initial encounter: Secondary | ICD-10-CM | POA: Insufficient documentation

## 2023-12-10 DIAGNOSIS — M79641 Pain in right hand: Secondary | ICD-10-CM | POA: Diagnosis not present

## 2023-12-10 DIAGNOSIS — M79631 Pain in right forearm: Secondary | ICD-10-CM | POA: Diagnosis not present

## 2023-12-10 DIAGNOSIS — S59911A Unspecified injury of right forearm, initial encounter: Secondary | ICD-10-CM | POA: Diagnosis present

## 2023-12-10 DIAGNOSIS — S0990XA Unspecified injury of head, initial encounter: Secondary | ICD-10-CM | POA: Diagnosis not present
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
## 2023-12-10 NOTE — Discharge Instructions (Addendum)
 Evaluation today was overall reassuring.  X-rays and CT of your head were negative for acute injury.  Please follow-up your PCP.  For the abrasions on your right forearm and hand recommend cleaning daily with soap and water.  You can also take Tylenol  ibuprofen  for pain and swelling.  If symptoms worsen or change in any way please return to the ED for further evaluation.

## 2024-01-19 ENCOUNTER — Other Ambulatory Visit: Payer: Self-pay | Admitting: Physician Assistant

## 2024-01-21 ENCOUNTER — Other Ambulatory Visit (HOSPITAL_COMMUNITY): Payer: Self-pay

## 2024-01-21 MED ORDER — SERTRALINE HCL 50 MG PO TABS
100.0000 mg | ORAL_TABLET | Freq: Every day | ORAL | 1 refills | Status: AC
  Filled 2024-01-21: qty 180, 90d supply, fill #0
  Filled 2024-04-22: qty 180, 90d supply, fill #1

## 2024-02-20 ENCOUNTER — Ambulatory Visit: Admitting: Physician Assistant

## 2024-05-20 ENCOUNTER — Ambulatory Visit: Admitting: Physician Assistant

## 2024-05-20 ENCOUNTER — Encounter: Payer: Self-pay | Admitting: Physician Assistant

## 2024-05-20 ENCOUNTER — Other Ambulatory Visit (HOSPITAL_COMMUNITY): Payer: Self-pay

## 2024-05-20 VITALS — BP 130/88 | HR 93 | Temp 98.2°F | Ht 63.0 in | Wt 161.5 lb

## 2024-05-20 DIAGNOSIS — M25552 Pain in left hip: Secondary | ICD-10-CM | POA: Diagnosis not present

## 2024-05-20 MED ORDER — MELOXICAM 15 MG PO TABS
15.0000 mg | ORAL_TABLET | Freq: Every day | ORAL | 0 refills | Status: AC
  Filled 2024-05-20: qty 30, 30d supply, fill #0

## 2024-05-20 NOTE — Progress Notes (Signed)
 History of Present Illness:   Chief Complaint  Patient presents with   Hip Pain    Pt c/o left hip pain radiating down leg behind knee, down front of leg. Denies numbness and tingling. Started in August.    Discussed the use of AI scribe software for clinical note transcription with the patient, who gave verbal consent to proceed.  History of Present Illness   Dorothy Fernandez is a 38 year old female who presents with persistent hip pain.  She has had constant hip pain since August that began suddenly without injury. The pain is sharp and achy, radiates down the leg, and worsens with bending at the waist, leg extension, prolonged sitting, and turning in bed. It was initially severe enough to make putting on shoes very difficult, and although the intensity has decreased, the pain still disrupts her sleep and daily activities.  The pain does not appear related to changes in her work, where she primarily sits. It persists despite these stable conditions.  She takes naproxen 220 mg, two tablets twice daily, plus Tylenol , which makes the pain bearable. She would like to switch to meloxicam , which she has tolerated in the past.  Reports there is no urinary changes/symptom(s), weakness in bilateral legs, saddle anesthesia.   Past Medical History:  Diagnosis Date   Adenomyosis of uterus 08/2022   Allergy    Anxiety    Depression    Dysrhythmia    propanolol for SVT   Fibroid    Kidney stone    Nephrolithiasis    Tachycardia    UTI (urinary tract infection)      Social History   Tobacco Use   Smoking status: Never   Smokeless tobacco: Never  Vaping Use   Vaping status: Never Used  Substance Use Topics   Alcohol use: Not Currently    Comment: 1 X a year   Drug use: No    Past Surgical History:  Procedure Laterality Date   EYE SURGERY  08/2022   Retinal tear repair   TONSILLECTOMY      Family History  Problem Relation Age of Onset   Hyperlipidemia Mother    Varicose  Veins Mother    Depression Father    Early death Father    Cancer Maternal Grandmother        Cervical   Diabetes Maternal Grandfather    Alcohol abuse Maternal Grandfather    Alcohol abuse Paternal Grandfather    Colon cancer Neg Hx    Breast cancer Neg Hx    Liver disease Neg Hx     Allergies  Allergen Reactions   Cephalosporins Rash   Erythromycin Rash   Penicillins Rash   Sulfa Antibiotics Rash   Ceclor [Cefaclor] Hives   Suprax [Cefixime] Hives   Bupropion  Hives    Itching   Septra [Sulfamethoxazole-Trimethoprim] Rash    Current Medications:   Current Outpatient Medications:    acetaminophen  (TYLENOL ) 500 MG tablet, Take 1,000 mg by mouth daily., Disp: , Rfl:    cholecalciferol (VITAMIN D3) 25 MCG (1000 UNIT) tablet, Take 1,000 Units by mouth daily., Disp: , Rfl:    Coenzyme Q10 (COQ-10) 100 MG CAPS, Take 1 capsule by mouth daily in the afternoon., Disp: , Rfl:    CREATINE MONOHYDRATE PO, Take 1 Scoop by mouth daily in the afternoon. 5 gram scoop, Disp: , Rfl:    diphenhydrAMINE  (BENADRYL ) 12.5 MG chewable tablet, Chew 12.5 mg by mouth 4 (four) times daily as needed for  allergies., Disp: , Rfl:    Docosahexaenoic Acid (DHA ALGAL-900 PO), Take 1 capsule by mouth daily in the afternoon., Disp: , Rfl:    Ferrous Sulfate Dried (SLOW RELEASE IRON) 45 MG TBCR, Take 1 tablet by mouth daily in the afternoon., Disp: , Rfl:    meloxicam  (MOBIC ) 15 MG tablet, Take 1 tablet (15 mg total) by mouth daily., Disp: 30 tablet, Rfl: 0   Multiple Vitamins-Minerals (MULTIVIT/MULTIMINERAL ADULT PO), Take 1 tablet by mouth daily in the afternoon., Disp: , Rfl:    naproxen sodium (ALEVE) 220 MG tablet, Take 220 mg by mouth 2 (two) times daily as needed. (Patient taking differently: Take 440 mg by mouth 2 (two) times daily as needed.), Disp: , Rfl:    nitrofurantoin , macrocrystal-monohydrate, (MACROBID ) 100 MG capsule, Take 1 capsule (100 mg total) by mouth as needed., Disp: 90 capsule, Rfl: 0    propranolol  ER (INDERAL  LA) 60 MG 24 hr capsule, Take 1 capsule (60 mg total) by mouth daily., Disp: 90 capsule, Rfl: 3   SEMAGLUTIDE-WEIGHT MANAGEMENT [place], once a week., Disp: , Rfl:    sertraline  (ZOLOFT ) 50 MG tablet, Take 2 tablets (100 mg total) by mouth daily., Disp: 180 tablet, Rfl: 1   triamcinolone cream (KENALOG) 0.1 %, Apply 1 application topically daily as needed., Disp: , Rfl:    Review of Systems:   Negative unless otherwise specified per HPI.  Vitals:   Vitals:   05/20/24 1132  BP: 130/88  Pulse: 93  Temp: 98.2 F (36.8 C)  TempSrc: Temporal  SpO2: 98%  Weight: 161 lb 8 oz (73.3 kg)  Height: 5' 3 (1.6 m)     Body mass index is 28.61 kg/m.  Physical Exam:   Physical Exam Constitutional:      Appearance: Normal appearance. She is well-developed.  HENT:     Head: Normocephalic and atraumatic.  Eyes:     General: Lids are normal.     Extraocular Movements: Extraocular movements intact.     Conjunctiva/sclera: Conjunctivae normal.  Pulmonary:     Effort: Pulmonary effort is normal.  Musculoskeletal:        General: Normal range of motion.     Cervical back: Normal range of motion and neck supple.     Comments: Tenderness to palpation to left SI joint Pain elicited with passive extension of left leg Normal gait No bony tenderness to palpation   Skin:    General: Skin is warm and dry.  Neurological:     Mental Status: She is alert and oriented to person, place, and time.  Psychiatric:        Attention and Perception: Attention and perception normal.        Mood and Affect: Mood normal.        Behavior: Behavior normal.        Thought Content: Thought content normal.        Judgment: Judgment normal.     Assessment and Plan:   Assessment and Plan    Left hip pain Chronic pain with differential diagnosis of piriformis syndrome and lumbar radiculopathy. Current management with ibuprofen  and acetaminophen  provides partial relief. - Referred to  sports medicine for further evaluation and possible advanced imaging. Referral to physical therapy per patient request. - Prescribed meloxicam  15 mg daily for one week, then as needed. - Provided handout on piriformis syndrome. - Advised against additional NSAIDs while taking meloxicam .      Lucie Buttner, PA-C

## 2024-05-24 ENCOUNTER — Other Ambulatory Visit (HOSPITAL_COMMUNITY): Payer: Self-pay

## 2024-05-24 DIAGNOSIS — F411 Generalized anxiety disorder: Secondary | ICD-10-CM | POA: Diagnosis not present

## 2024-05-24 DIAGNOSIS — Z1389 Encounter for screening for other disorder: Secondary | ICD-10-CM | POA: Diagnosis not present

## 2024-05-24 MED ORDER — SERTRALINE HCL 100 MG PO TABS
200.0000 mg | ORAL_TABLET | Freq: Every morning | ORAL | 3 refills | Status: AC
  Filled 2024-05-24: qty 60, 30d supply, fill #0

## 2024-05-24 MED ORDER — BUSPIRONE HCL 15 MG PO TABS
15.0000 mg | ORAL_TABLET | Freq: Two times a day (BID) | ORAL | 3 refills | Status: AC
  Filled 2024-05-24: qty 60, 30d supply, fill #0
  Filled 2024-06-30: qty 60, 30d supply, fill #1

## 2024-05-24 MED ORDER — ZOLPIDEM TARTRATE 10 MG PO TABS
10.0000 mg | ORAL_TABLET | Freq: Every evening | ORAL | 3 refills | Status: AC
  Filled 2024-05-24: qty 30, 30d supply, fill #0

## 2024-05-28 ENCOUNTER — Other Ambulatory Visit (HOSPITAL_COMMUNITY): Payer: Self-pay

## 2024-05-28 ENCOUNTER — Telehealth: Admitting: Physician Assistant

## 2024-05-28 DIAGNOSIS — J019 Acute sinusitis, unspecified: Secondary | ICD-10-CM | POA: Diagnosis not present

## 2024-05-28 DIAGNOSIS — B9689 Other specified bacterial agents as the cause of diseases classified elsewhere: Secondary | ICD-10-CM | POA: Diagnosis not present

## 2024-05-28 MED ORDER — DOXYCYCLINE HYCLATE 100 MG PO TABS
100.0000 mg | ORAL_TABLET | Freq: Two times a day (BID) | ORAL | 0 refills | Status: AC
  Filled 2024-05-28: qty 14, 7d supply, fill #0

## 2024-05-28 NOTE — Progress Notes (Signed)
 E-Visit for Sinus Problems  We are sorry that you are not feeling well.  Here is how we plan to help!  Based on what you have shared with me it looks like you have sinusitis.  Sinusitis is inflammation and infection in the sinus cavities of the head.  Based on your presentation I believe you most likely have Acute Bacterial Sinusitis.  This is an infection caused by bacteria and is treated with antibiotics. I have prescribed Doxycycline 100mg  by mouth twice a day for 7 days. You may use an oral decongestant such as Mucinex D or if you have glaucoma or high blood pressure use plain Mucinex. Saline nasal spray help and can safely be used as often as needed for congestion.  If you develop worsening sinus pain, fever or notice severe headache and vision changes, or if symptoms are not better after completion of antibiotic, please schedule an appointment with a health care provider.    Sinus infections are not as easily transmitted as other respiratory infection, however we still recommend that you avoid close contact with loved ones, especially the very young and elderly.  Remember to wash your hands thoroughly throughout the day as this is the number one way to prevent the spread of infection!  Home Care: Only take medications as instructed by your medical team. Complete the entire course of an antibiotic. Do not take these medications with alcohol. A steam or ultrasonic humidifier can help congestion.  You can place a towel over your head and breathe in the steam from hot water coming from a faucet. Avoid close contacts especially the very young and the elderly. Cover your mouth when you cough or sneeze. Always remember to wash your hands.  Get Help Right Away If: You develop worsening fever or sinus pain. You develop a severe head ache or visual changes. Your symptoms persist after you have completed your treatment plan.  Make sure you Understand these instructions. Will watch your  condition. Will get help right away if you are not doing well or get worse.  Your e-visit answers were reviewed by a board certified advanced clinical practitioner to complete your personal care plan.  Depending on the condition, your plan could have included both over the counter or prescription medications.  If there is a problem please reply  once you have received a response from your provider.  Your safety is important to us .  If you have drug allergies check your prescription carefully.    You can use MyChart to ask questions about today's visit, request a non-urgent call back, or ask for a work or school excuse for 24 hours related to this e-Visit. If it has been greater than 24 hours you will need to follow up with your provider, or enter a new e-Visit to address those concerns.  You will get an e-mail in the next two days asking about your experience.  I hope that your e-visit has been valuable and will speed your recovery. Thank you for using e-visits.  I have spent 5 minutes in review of e-visit questionnaire, review and updating patient chart, medical decision making and response to patient.   Dorothy Fernandez CHRISTELLA Dickinson, PA-C

## 2024-06-02 NOTE — Telephone Encounter (Signed)
 Noted

## 2024-06-16 ENCOUNTER — Encounter: Payer: Self-pay | Admitting: Physical Therapy

## 2024-06-16 ENCOUNTER — Ambulatory Visit (INDEPENDENT_AMBULATORY_CARE_PROVIDER_SITE_OTHER): Admitting: Physical Therapy

## 2024-06-16 DIAGNOSIS — M5416 Radiculopathy, lumbar region: Secondary | ICD-10-CM

## 2024-06-16 DIAGNOSIS — G5702 Lesion of sciatic nerve, left lower limb: Secondary | ICD-10-CM

## 2024-06-16 NOTE — Therapy (Signed)
 " OUTPATIENT PHYSICAL THERAPY LOWER EXTREMITY EVALUATION   Patient Name: Dorothy Fernandez MRN: [id number] DOB:01/11/1986, 38 y.o., female Today's Date: 06/16/2024  END OF SESSION:  PT End of Session - 06/16/24 1842     Visit Number 1    Number of Visits 16    Date for Recertification  08/11/24    Authorization Type [facility]/SAVE    PT Start Time 1348    PT Stop Time 1428    PT Time Calculation (min) 40 min    Activity Tolerance Patient tolerated treatment well    Behavior During Therapy [facility] for tasks assessed/performed          Past Medical History:  Diagnosis Date   Adenomyosis of uterus 08/2022   Allergy    Anxiety    Depression    Dysrhythmia    propanolol for SVT   Fibroid    Kidney stone    Nephrolithiasis    Tachycardia    UTI (urinary tract infection)    Past Surgical History:  Procedure Laterality Date   EYE SURGERY  08/2022   Retinal tear repair   TONSILLECTOMY     Patient Active Problem List   Diagnosis Date Noted   Thyroid  nodule found 2023 - needs annual u/s x 5 years 06/30/2022   Paroxysmal SVT (supraventricular tachycardia) 09/13/2021   Anxiety 11/03/2019   Vegetarian diet 11/03/2019   Frequent UTI 11/03/2019   Uterine fibroids affecting pregnancy 06/15/2019   Lumbar herniated disc 05/15/2002     PCP: Lucie Buttner  REFERRING PROVIDER: Lucie Buttner  REFERRING DIAG: L hip pain  THERAPY DIAG:  Radiculopathy, lumbar region  Piriformis syndrome of left side  Rationale for Evaluation and Treatment: Rehabilitation  ONSET DATE:     SUBJECTIVE:   SUBJECTIVE STATEMENT: L hip pain since August. No incident .Very severe at the time.  Posterior hip/glute pain and down the back of leg, also now down into lateral leg past knee and into toe, feels like  pins and needles. Hurst the most to sit. She has a desk job, sits for 8 hours.  Notes Previous disc herniation-  not sure of what side it was on, remembers more weakness in leg vs pain.    Exercise:  Sometimes walks, but not other exercise.  Sleep: waking up in the night with pain- variable.    PERTINENT HISTORY: SVT- on beta blocker   PAIN:  Are you having pain? Yes: NPRS scale: 5-6  /10 Pain location: R glute/ and LE Pain description: pain, shooting pain, pins and needles  Aggravating factors: sitting, bending fwd  Relieving factors: standing, walking   PRECAUTIONS: None  WEIGHT BEARING RESTRICTIONS: No  FALLS:  Has patient fallen in last 6 months? No   PLOF: Independent  PATIENT GOALS:  Decreased pain in glute, Leg   NEXT MD VISIT:   OBJECTIVE:   DIAGNOSTIC FINDINGS:   PATIENT SURVEYS:  Next visit   COGNITION: Overall cognitive status: Within functional limits for tasks assessed     SENSATION: WFL  EDEMA:    POSTURE:    No Significant postural limitations  PALPATION: Very tender in L piriformis and most of glute, minimal tenderness in SIJ. No pain to palpate lumbar spine or with PA s  of lumbar spine.   LOWER EXTREMITY ROM: Hips: WFL, hard to test on L due to pain Knees: WFL, unable to do full knee extension in sitting/ LAQ, due to nerve tension pain.  Lumbar:  flexion, mod/significant limitation with pain into  LE,  Extension/standing: mild limitation and soreness in glute,  prone: less pain  SB: WFL  LOWER EXTREMITY MMT:  MMT Left eval Right  eval  Hip flexion 4 4+  Hip extension    Hip abduction    Hip adduction    Hip internal rotation    Hip external rotation    Knee flexion 4+ 5  Knee extension 4+ 5  Ankle dorsiflexion    Ankle plantarflexion    Ankle inversion    Ankle eversion     (Blank rows = not tested)  LOWER EXTREMITY SPECIAL TESTS:  + SLR and nerve tension on L, pain with LAQ/full knee extension Increased LE pain with lumbar fwd flexion  FUNCTIONAL TESTS:    GAIT: unremarkable    TODAY'S TREATMENT:                                                                                                                               DATE:  Ther ex: pone laying x 2 min (on 1 pillow) no pain or LE pain Prone press ups to elbows x 10 then x 5,    trial standing extension/ pain Manual: STM/tennis ball to L glute, with education on performing for HEP LAD L for lumbar decompression.    PATIENT EDUCATION:  Education details: PT POC, Exam findings, HEP Person educated: Patient Education method: Explanation, Demonstration, Tactile cues, Verbal cues, and Handouts Education comprehension: verbalized understanding, returned demonstration, verbal cues required, tactile cues required, and needs further education   HOME EXERCISE PROGRAM: Access Code: [email or web address] URL: [email or web address] Date: 06/16/2024 Prepared by: Tinnie Don  Exercises - Lying Prone with 1 Pillow  - Prone Press Up On Elbows  - 3 x daily - 1 sets - 10 reps  ASSESSMENT:  CLINICAL IMPRESSION: Patient presents with primary complaint of  pain in  L LE. She has most discomfort in L glute, radiating into leg and toes. She has no pain in low back at this time. She has + nerve tension on L. She has improved comfort with prone laying and press ups to elbows today. She has decreased tolerance for sitting and work duties due to pain. Pt with symptoms of lumbar radiculopathy vs piriformis due to much pain in piriformis today.  Pt with decreased ability for full functional activities. Pt will  benefit from skilled PT to improve deficits and pain and to return to PLOF.   OBJECTIVE IMPAIRMENTS: decreased activity tolerance, decreased mobility, decreased ROM, decreased strength, increased muscle spasms, impaired flexibility, improper body mechanics, and pain.   ACTIVITY LIMITATIONS: lifting, bending, sitting, squatting, sleeping, stairs, transfers, hygiene/grooming, and locomotion level  PARTICIPATION LIMITATIONS: meal prep, cleaning, laundry, driving, shopping, community activity, and occupation  PERSONAL FACTORS: Past/current  experiences and Time since onset of injury/illness/exacerbation are also affecting patient's functional outcome.   REHAB POTENTIAL: Good  CLINICAL DECISION MAKING: Stable/uncomplicated  EVALUATION COMPLEXITY: Low   GOALS: Goals reviewed with patient? Yes  SHORT  TERM GOALS: Target date: 07/07/2024   Pt to be independent with initial HEP  Goal status: INITIAL  2.  Pt to report decreased LE symptoms by 25 %.   Goal status: INITIAL  3.  Pt to report getting up from seated position at work at least every hour.   Goal status: INITIAL    LONG TERM GOALS: Target date: 08/11/2024   Pt to be independent with final HEP  Goal status: INITIAL  2.  Pt to report ability to sit for at least 1 hour, with pain 0-2/10 in LE.   Goal status: INITIAL  3.  Pt to report radicular/ LE pain to be 0-2/10 overall.   Goal status: INITIAL  4.  Pt to demo lumbar ROM to be [facility] for pt age, to improve ability for ADLS and IADLs.   Goal status: INITIAL  5.  Pt to demo ability and optimal mechanics for bend, lift, squat, to improve ability for IADLs.   Goal status: INITIAL    PLAN:  PT FREQUENCY: 1-2x/week  PT DURATION: 8 weeks  PLANNED INTERVENTIONS: Therapeutic exercises, Therapeutic activity, Neuromuscular re-education, Patient/Family education, Self Care, Joint mobilization, Joint manipulation, Stair training, Orthotic/Fit training, DME instructions, Aquatic Therapy, Dry Needling, Electrical stimulation, Cryotherapy, Moist heat, Taping, Ultrasound, Ionotophoresis 4mg /ml Dexamethasone, Manual therapy,  Vasopneumatic device, Traction, Spinal manipulation, Spinal mobilization,Balance training, Gait training,   PLAN FOR NEXT SESSION: lumbar extension prone progression, standing extension, LAD, core strength, manual for L glute pain.    Tinnie Don, PT, DPT 6:44 PM  06/16/2024   "

## 2024-06-23 ENCOUNTER — Ambulatory Visit (INDEPENDENT_AMBULATORY_CARE_PROVIDER_SITE_OTHER): Admitting: Physical Therapy

## 2024-06-23 ENCOUNTER — Other Ambulatory Visit (HOSPITAL_COMMUNITY): Payer: Self-pay

## 2024-06-23 ENCOUNTER — Other Ambulatory Visit: Payer: Self-pay

## 2024-06-23 ENCOUNTER — Encounter: Payer: Self-pay | Admitting: Physical Therapy

## 2024-06-23 DIAGNOSIS — M5416 Radiculopathy, lumbar region: Secondary | ICD-10-CM | POA: Diagnosis not present

## 2024-06-23 DIAGNOSIS — G5702 Lesion of sciatic nerve, left lower limb: Secondary | ICD-10-CM

## 2024-06-23 MED ORDER — ZALEPLON 10 MG PO CAPS
10.0000 mg | ORAL_CAPSULE | Freq: Every evening | ORAL | 1 refills | Status: AC
  Filled 2024-06-23: qty 30, 30d supply, fill #0

## 2024-06-23 MED ORDER — VILAZODONE HCL 40 MG PO TABS
40.0000 mg | ORAL_TABLET | Freq: Every day | ORAL | 0 refills | Status: AC
  Filled 2024-06-23: qty 30, 30d supply, fill #0

## 2024-06-23 NOTE — Therapy (Signed)
 " OUTPATIENT PHYSICAL THERAPY LOWER EXTREMITY TREATMENT   Patient Name: Dorothy Fernandez MRN: [id number] DOB:[birth year], 39 y.o., female Today's Date: 06/23/2024  END OF SESSION:  PT End of Session - 06/23/24 0802     Visit Number 2    Number of Visits 16    Date for Recertification  08/11/24    Authorization Type [facility]/SAVE    PT Start Time 0804    PT Stop Time 0845    PT Time Calculation (min) 41 min    Activity Tolerance Patient tolerated treatment well    Behavior During Therapy [facility] for tasks assessed/performed          Past Medical History:  Diagnosis Date   Adenomyosis of uterus 08/2022   Allergy    Anxiety    Depression    Dysrhythmia    propanolol for SVT   Fibroid    Kidney stone    Nephrolithiasis    Tachycardia    UTI (urinary tract infection)    Past Surgical History:  Procedure Laterality Date   EYE SURGERY  08/2022   Retinal tear repair   TONSILLECTOMY     Patient Active Problem List   Diagnosis Date Noted   Thyroid  nodule found 2023 - needs annual u/s x 5 years 06/30/2022   Paroxysmal SVT (supraventricular tachycardia) 09/13/2021   Anxiety 11/03/2019   Vegetarian diet 11/03/2019   Frequent UTI 11/03/2019   Uterine fibroids affecting pregnancy 06/15/2019   Lumbar herniated disc 05/15/2002     PCP: Lucie Buttner  REFERRING PROVIDER: Lucie Buttner  REFERRING DIAG: L hip pain  THERAPY DIAG:  Radiculopathy, lumbar region  Piriformis syndrome of left side  Rationale for Evaluation and Treatment: Rehabilitation  ONSET DATE:     SUBJECTIVE:   SUBJECTIVE STATEMENT: Pt states mild improvements in leg pain. Has been trying to stand up more at work, waiting on standing desk approval. Has been doing HEP   Eval: L hip pain since August. No incident .Very severe at the time.  Posterior hip/glute pain and down the back of leg, also now down into lateral leg past knee and into toe, feels like  pins and needles. Hurst the most to sit. She has a  desk job, sits for 8 hours.  Notes Previous disc herniation-  not sure of what side it was on, remembers more weakness in leg vs pain.   Exercise:  Sometimes walks, but not other exercise.  Sleep: waking up in the night with pain- variable.    PERTINENT HISTORY: SVT- on beta blocker   PAIN:  Are you having pain? Yes: NPRS scale: 5-6  /10 Pain location: R glute/ and LE Pain description: pain, shooting pain, pins and needles  Aggravating factors: sitting, bending fwd  Relieving factors: standing, walking   PRECAUTIONS: None  WEIGHT BEARING RESTRICTIONS: No  FALLS:  Has patient fallen in last 6 months? No   PLOF: Independent  PATIENT GOALS:  Decreased pain in glute, Leg   NEXT MD VISIT:   OBJECTIVE:   DIAGNOSTIC FINDINGS:   PATIENT SURVEYS:  Next visit   COGNITION: Overall cognitive status: Within functional limits for tasks assessed     SENSATION: WFL  EDEMA:    POSTURE:    No Significant postural limitations  PALPATION: Very tender in L piriformis and most of glute, minimal tenderness in SIJ. No pain to palpate lumbar spine or with PA s  of lumbar spine.   LOWER EXTREMITY ROM: Hips: WFL, hard to test on L  due to pain Knees: WFL, unable to do full knee extension in sitting/ LAQ, due to nerve tension pain.  Lumbar:  flexion, mod/significant limitation with pain into LE,  Extension/standing: mild limitation and soreness in glute,  prone: less pain  SB: WFL  LOWER EXTREMITY MMT:  MMT Left eval Right  eval  Hip flexion 4 4+  Hip extension    Hip abduction    Hip adduction    Hip internal rotation    Hip external rotation    Knee flexion 4+ 5  Knee extension 4+ 5  Ankle dorsiflexion    Ankle plantarflexion    Ankle inversion    Ankle eversion     (Blank rows = not tested)  LOWER EXTREMITY SPECIAL TESTS:  + SLR and nerve tension on L, pain with LAQ/full knee extension Increased LE pain with lumbar fwd flexion  FUNCTIONAL TESTS:    GAIT:  unremarkable    TODAY'S TREATMENT:                                                                                                                              DATE:   06/23/24:  Therapeutic Exercise: Aerobic: Supine: prone press ups 2 x 10;   bridging 2 x 10;  S/L:  clams 2 x 10;  Seated: Standing:  extension 2 x 10; review of HEP for doing extension throughout day.  Stretches:   Neuromuscular Re-education: Manual Therapy:  STM/tennis ball in S/L in L glute and piriformis (education and pt self practice with ball at wall for HEP) ; LAD on L LE, for L lumbar  Therapeutic Activity: Self Care:   Ther ex: pone laying x 2 min (on 1 pillow) no pain or LE pain Prone press ups to elbows x 10 then x 5,    trial standing extension/ pain Manual: STM/tennis ball to L glute, with education on performing for HEP LAD L for lumbar decompression.    PATIENT EDUCATION:  Education details: updated and reviewed HEP  Person educated: Patient Education method: Explanation, Demonstration, Tactile cues, Verbal cues, and Handouts Education comprehension: verbalized understanding, returned demonstration, verbal cues required, tactile cues required, and needs further education   HOME EXERCISE PROGRAM: Access Code: [email or web address] URL: [email or web address] Date: 06/16/2024 Prepared by: Tinnie Don  Exercises - Lying Prone with 1 Pillow  - Prone Press Up On Elbows  - 3 x daily - 1 sets - 10 reps  ASSESSMENT:  CLINICAL IMPRESSION: Pt with mild decrease in pain in LE. She continues to have much soreness in L glute but decreased from last visit. Educated on continued self manual for muscle tension as well as continued extension in prone and standing while at work for decompression and LE symptoms.   Eval: Patient presents with primary complaint of  pain in  L LE. She has most discomfort in L glute, radiating into leg and toes. She has no pain in low back at this time. She has +  nerve  tension on L. She has improved comfort with prone laying and press ups to elbows today. She has decreased tolerance for sitting and work duties due to pain. Pt with symptoms of lumbar radiculopathy vs piriformis due to much pain in piriformis today.  Pt with decreased ability for full functional activities. Pt will  benefit from skilled PT to improve deficits and pain and to return to PLOF.   OBJECTIVE IMPAIRMENTS: decreased activity tolerance, decreased mobility, decreased ROM, decreased strength, increased muscle spasms, impaired flexibility, improper body mechanics, and pain.   ACTIVITY LIMITATIONS: lifting, bending, sitting, squatting, sleeping, stairs, transfers, hygiene/grooming, and locomotion level  PARTICIPATION LIMITATIONS: meal prep, cleaning, laundry, driving, shopping, community activity, and occupation  PERSONAL FACTORS: Past/current experiences and Time since onset of injury/illness/exacerbation are also affecting patient's functional outcome.   REHAB POTENTIAL: Good  CLINICAL DECISION MAKING: Stable/uncomplicated  EVALUATION COMPLEXITY: Low   GOALS: Goals reviewed with patient? Yes  SHORT TERM GOALS: Target date: 07/07/2024   Pt to be independent with initial HEP  Goal status: INITIAL  2.  Pt to report decreased LE symptoms by 25 %.   Goal status: INITIAL  3.  Pt to report getting up from seated position at work at least every hour.   Goal status: INITIAL    LONG TERM GOALS: Target date: 08/11/2024   Pt to be independent with final HEP  Goal status: INITIAL  2.  Pt to report ability to sit for at least 1 hour, with pain 0-2/10 in LE.   Goal status: INITIAL  3.  Pt to report radicular/ LE pain to be 0-2/10 overall.   Goal status: INITIAL  4.  Pt to demo lumbar ROM to be [facility] for pt age, to improve ability for ADLS and IADLs.   Goal status: INITIAL  5.  Pt to demo ability and optimal mechanics for bend, lift, squat, to improve ability for IADLs.    Goal status: INITIAL    PLAN:  PT FREQUENCY: 1-2x/week  PT DURATION: 8 weeks  PLANNED INTERVENTIONS: Therapeutic exercises, Therapeutic activity, Neuromuscular re-education, Patient/Family education, Self Care, Joint mobilization, Joint manipulation, Stair training, Orthotic/Fit training, DME instructions, Aquatic Therapy, Dry Needling, Electrical stimulation, Cryotherapy, Moist heat, Taping, Ultrasound, Ionotophoresis 4mg /ml Dexamethasone, Manual therapy,  Vasopneumatic device, Traction, Spinal manipulation, Spinal mobilization,Balance training, Gait training,   PLAN FOR NEXT SESSION:  lumbar extension prone progression, standing extension, LAD, core strength, manual for L glute pain.    Tinnie Don, PT, DPT 8:46 AM  06/23/2024   "

## 2024-06-30 ENCOUNTER — Ambulatory Visit: Admitting: Physical Therapy

## 2024-06-30 ENCOUNTER — Encounter: Payer: Self-pay | Admitting: Physical Therapy

## 2024-06-30 ENCOUNTER — Ambulatory Visit (INDEPENDENT_AMBULATORY_CARE_PROVIDER_SITE_OTHER): Admitting: Physical Therapy

## 2024-06-30 DIAGNOSIS — M5416 Radiculopathy, lumbar region: Secondary | ICD-10-CM

## 2024-06-30 DIAGNOSIS — G5702 Lesion of sciatic nerve, left lower limb: Secondary | ICD-10-CM

## 2024-06-30 NOTE — Therapy (Signed)
 " OUTPATIENT PHYSICAL THERAPY LOWER EXTREMITY TREATMENT   Patient Name: Dorothy Fernandez MRN: [id number] DOB:01-Mar-1986, 39 y.o., female Today's Date: 06/30/2024  END OF SESSION:  PT End of Session - 06/30/24 0825     Visit Number 3    Number of Visits 16    Date for Recertification  08/11/24    Authorization Type [facility]/SAVE    PT Start Time 0800    PT Stop Time 0840    PT Time Calculation (min) 40 min    Activity Tolerance Patient tolerated treatment well    Behavior During Therapy [facility] for tasks assessed/performed           Past Medical History:  Diagnosis Date   Adenomyosis of uterus 08/2022   Allergy    Anxiety    Depression    Dysrhythmia    propanolol for SVT   Fibroid    Kidney stone    Nephrolithiasis    Tachycardia    UTI (urinary tract infection)    Past Surgical History:  Procedure Laterality Date   EYE SURGERY  08/2022   Retinal tear repair   TONSILLECTOMY     Patient Active Problem List   Diagnosis Date Noted   Thyroid  nodule found 2023 - needs annual u/s x 5 years 06/30/2022   Paroxysmal SVT (supraventricular tachycardia) 09/13/2021   Anxiety 11/03/2019   Vegetarian diet 11/03/2019   Frequent UTI 11/03/2019   Uterine fibroids affecting pregnancy 06/15/2019   Lumbar herniated disc 05/15/2002     PCP: Lucie Buttner  REFERRING PROVIDER: Lucie Buttner  REFERRING DIAG: L hip pain  THERAPY DIAG:  Radiculopathy, lumbar region  Piriformis syndrome of left side  Rationale for Evaluation and Treatment: Rehabilitation  ONSET DATE:     SUBJECTIVE:   SUBJECTIVE STATEMENT: Pt states increased pain on Friday, 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 Feeling a bit better now, but leg has been more sore for a couple days.    Eval: L hip pain since August. No incident .Very severe at the time.  Posterior hip/glute pain and down the back of leg, also now down into lateral leg past knee and into toe, feels like  pins and needles. Hurst the  most to sit. She has a desk job, sits for 8 hours.  Notes Previous disc herniation-  not sure of what side it was on, remembers more weakness in leg vs pain.   Exercise:  Sometimes walks, but not other exercise.  Sleep: waking up in the night with pain- variable.    PERTINENT HISTORY: SVT- on beta blocker   PAIN:  Are you having pain? Yes: NPRS scale: 5-6  /10 Pain location: R glute/ and LE Pain description: pain, shooting pain, pins and needles  Aggravating factors: sitting, bending fwd  Relieving factors: standing, walking   PRECAUTIONS: None  WEIGHT BEARING RESTRICTIONS: No  FALLS:  Has patient fallen in last 6 months? No   PLOF: Independent  PATIENT GOALS:  Decreased pain in glute, Leg   NEXT MD VISIT:   OBJECTIVE:   DIAGNOSTIC FINDINGS:   PATIENT SURVEYS:  Next visit   COGNITION: Overall cognitive status: Within functional limits for tasks assessed     SENSATION: WFL  EDEMA:    POSTURE:    No Significant postural limitations  PALPATION: Very tender in L piriformis and most of glute, minimal tenderness in SIJ. No pain to palpate lumbar spine or with PA s  of lumbar spine.   LOWER EXTREMITY  ROM: Hips: WFL, hard to test on L due to pain Knees: WFL, unable to do full knee extension in sitting/ LAQ, due to nerve tension pain.  Lumbar:  flexion, mod/significant limitation with pain into LE,  Extension/standing: mild limitation and soreness in glute,  prone: less pain  SB: WFL  LOWER EXTREMITY MMT:  MMT Left eval Right  eval  Hip flexion 4 4+  Hip extension    Hip abduction    Hip adduction    Hip internal rotation    Hip external rotation    Knee flexion 4+ 5  Knee extension 4+ 5  Ankle dorsiflexion    Ankle plantarflexion    Ankle inversion    Ankle eversion     (Blank rows = not tested)  LOWER EXTREMITY SPECIAL TESTS:  + SLR and nerve tension on L, pain with LAQ/full knee extension Increased LE pain with lumbar fwd flexion  FUNCTIONAL  TESTS:    GAIT: unremarkable    TODAY'S TREATMENT:                                                                                                                              DATE:   06/30/24: Therapeutic Exercise: Aerobic: Supine: prone on 2 pillows during manual;   prone press ups to hands 2 x 10;   bridging 2 x 10;  Education on TA for active contraction x 5;   Supine march with TA x 10;  S/L:   Seated: Standing:  extension 2 x 10; review of HEP for doing extension throughout day.  Standing extension x 10;  Side glides to the Right 2 x 10;  Stretches:   Neuromuscular Re-education: Manual Therapy:  STM/ DTM to L lumbar paraspinals;   lumbar PA mobs;   LAD on L LE, for L lumbar  Therapeutic Activity:  sit to stand x 5; squat at mat table;  squat to 16 in box - lift 15 lb with education on mechanics. - all with education on TA with movement  Self Care:    06/23/24:  Therapeutic Exercise: Aerobic: Supine: prone press ups 2 x 10;   bridging 2 x 10;  S/L:  clams 2 x 10;  Seated: Standing:  extension 2 x 10; review of HEP for doing extension throughout day.  Stretches:   Neuromuscular Re-education: Manual Therapy:  STM/tennis ball in S/L in L glute and piriformis (education and pt self practice with ball at wall for HEP) ; LAD on L LE, for L lumbar  Therapeutic Activity: Self Care:   Ther ex: pone laying x 2 min (on 1 pillow) no pain or LE pain Prone press ups to elbows x 10 then x 5,    trial standing extension/ pain Manual: STM/tennis ball to L glute, with education on performing for HEP LAD L for lumbar decompression.    PATIENT EDUCATION:  Education details: updated and reviewed HEP  Person educated: Patient Education method: Explanation, Demonstration,  Tactile cues, Verbal cues, and Handouts Education comprehension: verbalized understanding, returned demonstration, verbal cues required, tactile cues required, and needs further education   HOME EXERCISE  PROGRAM: Access Code: [email or web address] URL: [email or web address] Date: 06/16/2024 Prepared by: Tinnie Don  Exercises - Lying Prone with 1 Pillow  - Prone Press Up On Elbows  - 3 x daily - 1 sets - 10 reps  ASSESSMENT:  CLINICAL IMPRESSION: Pt with decreased pain and muscle tension in L side of back and into glute after manual and prone positioning today. Improved mechanics with bend , squat after education but will benefit from continued work on this. Good ability for TA contraction, will continue to practice holding TA with movement for lumbar stabilization.   Eval: Patient presents with primary complaint of  pain in  L LE. She has most discomfort in L glute, radiating into leg and toes. She has no pain in low back at this time. She has + nerve tension on L. She has improved comfort with prone laying and press ups to elbows today. She has decreased tolerance for sitting and work duties due to pain. Pt with symptoms of lumbar radiculopathy vs piriformis due to much pain in piriformis today.  Pt with decreased ability for full functional activities. Pt will  benefit from skilled PT to improve deficits and pain and to return to PLOF.   OBJECTIVE IMPAIRMENTS: decreased activity tolerance, decreased mobility, decreased ROM, decreased strength, increased muscle spasms, impaired flexibility, improper body mechanics, and pain.   ACTIVITY LIMITATIONS: lifting, bending, sitting, squatting, sleeping, stairs, transfers, hygiene/grooming, and locomotion level  PARTICIPATION LIMITATIONS: meal prep, cleaning, laundry, driving, shopping, community activity, and occupation  PERSONAL FACTORS: Past/current experiences and Time since onset of injury/illness/exacerbation are also affecting patient's functional outcome.   REHAB POTENTIAL: Good  CLINICAL DECISION MAKING: Stable/uncomplicated  EVALUATION COMPLEXITY: Low   GOALS: Goals reviewed with patient? Yes  SHORT TERM GOALS: Target date:  07/07/2024   Pt to be independent with initial HEP  Goal status: INITIAL  2.  Pt to report decreased LE symptoms by 25 %.   Goal status: INITIAL  3.  Pt to report getting up from seated position at work at least every hour.   Goal status: INITIAL    LONG TERM GOALS: Target date: 08/11/2024   Pt to be independent with final HEP  Goal status: INITIAL  2.  Pt to report ability to sit for at least 1 hour, with pain 0-2/10 in LE.   Goal status: INITIAL  3.  Pt to report radicular/ LE pain to be 0-2/10 overall.   Goal status: INITIAL  4.  Pt to demo lumbar ROM to be [facility] for pt age, to improve ability for ADLS and IADLs.   Goal status: INITIAL  5.  Pt to demo ability and optimal mechanics for bend, lift, squat, to improve ability for IADLs.   Goal status: INITIAL    PLAN:  PT FREQUENCY: 1-2x/week  PT DURATION: 8 weeks  PLANNED INTERVENTIONS: Therapeutic exercises, Therapeutic activity, Neuromuscular re-education, Patient/Family education, Self Care, Joint mobilization, Joint manipulation, Stair training, Orthotic/Fit training, DME instructions, Aquatic Therapy, Dry Needling, Electrical stimulation, Cryotherapy, Moist heat, Taping, Ultrasound, Ionotophoresis 4mg /ml Dexamethasone, Manual therapy,  Vasopneumatic device, Traction, Spinal manipulation, Spinal mobilization,Balance training, Gait training,   PLAN FOR NEXT SESSION:  lumbar extension prone progression, standing extension, LAD, core strength, manual for L glute pain., manual to L paraspinals.    Tinnie Don, PT, DPT 12:02 PM  06/30/2024   "

## 2024-07-03 ENCOUNTER — Encounter: Admitting: Physical Therapy

## 2024-07-08 ENCOUNTER — Encounter: Admitting: Physical Therapy

## 2024-07-10 ENCOUNTER — Encounter: Payer: Self-pay | Admitting: Physical Therapy

## 2024-07-10 ENCOUNTER — Ambulatory Visit: Admitting: Physical Therapy

## 2024-07-10 DIAGNOSIS — G5702 Lesion of sciatic nerve, left lower limb: Secondary | ICD-10-CM

## 2024-07-10 DIAGNOSIS — M5416 Radiculopathy, lumbar region: Secondary | ICD-10-CM | POA: Diagnosis not present

## 2024-07-10 NOTE — Therapy (Signed)
 " OUTPATIENT PHYSICAL THERAPY LOWER EXTREMITY TREATMENT   Patient Name: Dorothy Fernandez MRN: [id number] DOB:August 23, 1985, 39 y.o., female Today's Date: 07/10/2024  END OF SESSION:  PT End of Session - 07/10/24 0809     Visit Number 4    Number of Visits 16    Date for Recertification  08/11/24    Authorization Type [facility]/SAVE    PT Start Time 0808    PT Stop Time 0847    PT Time Calculation (min) 39 min    Activity Tolerance Patient tolerated treatment well    Behavior During Therapy [facility] for tasks assessed/performed           Past Medical History:  Diagnosis Date   Adenomyosis of uterus 08/2022   Allergy    Anxiety    Depression    Dysrhythmia    propanolol for SVT   Fibroid    Kidney stone    Nephrolithiasis    Tachycardia    UTI (urinary tract infection)    Past Surgical History:  Procedure Laterality Date   EYE SURGERY  08/2022   Retinal tear repair   TONSILLECTOMY     Patient Active Problem List   Diagnosis Date Noted   Thyroid  nodule found 2023 - needs annual u/s x 5 years 06/30/2022   Paroxysmal SVT (supraventricular tachycardia) 09/13/2021   Anxiety 11/03/2019   Vegetarian diet 11/03/2019   Frequent UTI 11/03/2019   Uterine fibroids affecting pregnancy 06/15/2019   Lumbar herniated disc 05/15/2002     PCP: Lucie Buttner  REFERRING PROVIDER: Lucie Buttner  REFERRING DIAG: L hip pain  THERAPY DIAG:  Radiculopathy, lumbar region  Piriformis syndrome of left side  Rationale for Evaluation and Treatment: Rehabilitation  ONSET DATE:     SUBJECTIVE:   SUBJECTIVE STATEMENT: Pain into L glute, still quite sore, but pain down leg is much better. Minimal pain in low back, mostly in glute. Has been doing HEP.   Eval: L hip pain since August. No incident .Very severe at the time.  Posterior hip/glute pain and down the back of leg, also now down into lateral leg past knee and into toe, feels like  pins and needles. Hurst the most to sit. She has a  desk job, sits for 8 hours.  Notes Previous disc herniation-  not sure of what side it was on, remembers more weakness in leg vs pain.   Exercise:  Sometimes walks, but not other exercise.  Sleep: waking up in the night with pain- variable.    PERTINENT HISTORY: SVT- on beta blocker   PAIN:  Are you having pain? Yes: NPRS scale: 3-4 /10 Pain location: R glute/ and LE Pain description: pain, shooting pain, pins and needles  Aggravating factors: sitting, bending fwd  Relieving factors: standing, walking   PRECAUTIONS: None  WEIGHT BEARING RESTRICTIONS: No  FALLS:  Has patient fallen in last 6 months? No   PLOF: Independent  PATIENT GOALS:  Decreased pain in glute, Leg   NEXT MD VISIT:   OBJECTIVE:   DIAGNOSTIC FINDINGS:   PATIENT SURVEYS:  Next visit   COGNITION: Overall cognitive status: Within functional limits for tasks assessed     SENSATION: WFL  EDEMA:    POSTURE:    No Significant postural limitations  PALPATION: Very tender in L piriformis and most of glute, minimal tenderness in SIJ. No pain to palpate lumbar spine or with PA s  of lumbar spine.   LOWER EXTREMITY ROM: Hips: WFL, hard to test on  L due to pain Knees: WFL, unable to do full knee extension in sitting/ LAQ, due to nerve tension pain.  Lumbar:  flexion, mod/significant limitation with pain into LE,  Extension/standing: mild limitation and soreness in glute,  prone: less pain  SB: WFL  LOWER EXTREMITY MMT:  MMT Left eval Right  eval  Hip flexion 4 4+  Hip extension    Hip abduction    Hip adduction    Hip internal rotation    Hip external rotation    Knee flexion 4+ 5  Knee extension 4+ 5  Ankle dorsiflexion    Ankle plantarflexion    Ankle inversion    Ankle eversion     (Blank rows = not tested)  LOWER EXTREMITY SPECIAL TESTS:  + SLR and nerve tension on L, pain with LAQ/full knee extension Increased LE pain with lumbar fwd flexion  FUNCTIONAL TESTS:    GAIT:  unremarkable    TODAY'S TREATMENT:                                                                                                                              DATE:   07/10/24: Therapeutic Exercise: Aerobic: Supine:   prone press ups to hands 2 x 10;    bridging 2 x 10;  Education on TA for active contraction x 5;   Supine march with TA x 10;  S/L:  side plank x 3 bil (much harder on L, no pain)  Seated: Quadruped: LE lifts with TA x 15;  Standing:  extension 2 x 10; review of HEP for doing extension throughout day.   Side glides to the Right 2 x 10;  Stretches:  piriformis seated and supine ;  Neuromuscular Re-education: Manual Therapy:  STM/ DTM to L lumbar paraspinals and to L piriformis (tennis ball);   lumbar PA mobs;   LAD on L LE, for L lumbar  Therapeutic Activity:   Self Care:   06/30/24: Therapeutic Exercise: Aerobic: Supine:   prone press ups to hands 2 x 10;    bridging 2 x 10;  Education on TA for active contraction x 5;   Supine march with TA x 10;  S/L:  side plank x 3 bil (much harder on L, no pain)  Seated: Quadruped: LE lifts with TA x 10;  Standing:  extension 2 x 10; review of HEP for doing extension throughout day.   Side glides to the Right 2 x 10;  Stretches:  piriformis seated and supine ;  Neuromuscular Re-education: Manual Therapy:  STM/ DTM to L lumbar paraspinals and to L piriformis (tennis ball);   lumbar PA mobs;   LAD on L LE, for L lumbar  Therapeutic Activity:   Self Care:   06/23/24:  Therapeutic Exercise: Aerobic: Supine: prone press ups 2 x 10;   bridging 2 x 10;  S/L:  clams 2 x 10;  Seated: Standing:  extension 2 x 10; review of HEP  for doing extension throughout day.  Stretches:   Neuromuscular Re-education: Manual Therapy:  STM/tennis ball in S/L in L glute and piriformis (education and pt self practice with ball at wall for HEP) ; LAD on L LE, for L lumbar  Therapeutic Activity: Self Care:   Ther ex: pone laying x 2 min (on  1 pillow) no pain or LE pain Prone press ups to elbows x 10 then x 5,    trial standing extension/ pain Manual: STM/tennis ball to L glute, with education on performing for HEP LAD L for lumbar decompression.    PATIENT EDUCATION:  Education details: updated and reviewed HEP  Person educated: Patient Education method: Explanation, Demonstration, Tactile cues, Verbal cues, and Handouts Education comprehension: verbalized understanding, returned demonstration, verbal cues required, tactile cues required, and needs further education   HOME EXERCISE PROGRAM: Access Code: [email or web address] URL: [email or web address] Date: 06/16/2024 Prepared by: Tinnie Don  Exercises - Lying Prone with 1 Pillow  - Prone Press Up On Elbows  - 3 x daily - 1 sets - 10 reps  ASSESSMENT:  CLINICAL IMPRESSION: Pt progressing well. She has decreasing radicular pain, still with soreness in glute. Updated HEP today to include more core strength as well as piriformis stretch for glute. Plan to continue core strength and add more hip abd strength next visit as tolerated.   Eval: Patient presents with primary complaint of  pain in  L LE. She has most discomfort in L glute, radiating into leg and toes. She has no pain in low back at this time. She has + nerve tension on L. She has improved comfort with prone laying and press ups to elbows today. She has decreased tolerance for sitting and work duties due to pain. Pt with symptoms of lumbar radiculopathy vs piriformis due to much pain in piriformis today.  Pt with decreased ability for full functional activities. Pt will  benefit from skilled PT to improve deficits and pain and to return to PLOF.   OBJECTIVE IMPAIRMENTS: decreased activity tolerance, decreased mobility, decreased ROM, decreased strength, increased muscle spasms, impaired flexibility, improper body mechanics, and pain.   ACTIVITY LIMITATIONS: lifting, bending, sitting, squatting, sleeping,  stairs, transfers, hygiene/grooming, and locomotion level  PARTICIPATION LIMITATIONS: meal prep, cleaning, laundry, driving, shopping, community activity, and occupation  PERSONAL FACTORS: Past/current experiences and Time since onset of injury/illness/exacerbation are also affecting patient's functional outcome.   REHAB POTENTIAL: Good  CLINICAL DECISION MAKING: Stable/uncomplicated  EVALUATION COMPLEXITY: Low   GOALS: Goals reviewed with patient? Yes  SHORT TERM GOALS: Target date: 07/07/2024   Pt to be independent with initial HEP  Goal status: INITIAL  2.  Pt to report decreased LE symptoms by 25 %.   Goal status: INITIAL  3.  Pt to report getting up from seated position at work at least every hour.   Goal status: INITIAL    LONG TERM GOALS: Target date: 08/11/2024   Pt to be independent with final HEP  Goal status: INITIAL  2.  Pt to report ability to sit for at least 1 hour, with pain 0-2/10 in LE.   Goal status: INITIAL  3.  Pt to report radicular/ LE pain to be 0-2/10 overall.   Goal status: INITIAL  4.  Pt to demo lumbar ROM to be [facility] for pt age, to improve ability for ADLS and IADLs.   Goal status: INITIAL  5.  Pt to demo ability and optimal mechanics for bend, lift, squat, to improve ability for IADLs.  Goal status: INITIAL    PLAN:  PT FREQUENCY: 1-2x/week  PT DURATION: 8 weeks  PLANNED INTERVENTIONS: Therapeutic exercises, Therapeutic activity, Neuromuscular re-education, Patient/Family education, Self Care, Joint mobilization, Joint manipulation, Stair training, Orthotic/Fit training, DME instructions, Aquatic Therapy, Dry Needling, Electrical stimulation, Cryotherapy, Moist heat, Taping, Ultrasound, Ionotophoresis 4mg /ml Dexamethasone, Manual therapy,  Vasopneumatic device, Traction, Spinal manipulation, Spinal mobilization,Balance training, Gait training,   PLAN FOR NEXT SESSION:  lumbar extension prone progression, standing  extension, LAD, core strength, manual for L glute pain., manual to L paraspinals.  Hip abd   Tinnie Don, PT, DPT 8:09 AM  07/10/24   "

## 2024-07-15 ENCOUNTER — Encounter: Admitting: Physical Therapy

## 2024-07-17 ENCOUNTER — Encounter: Admitting: Physical Therapy

## 2024-10-27 ENCOUNTER — Ambulatory Visit: Admitting: Dermatology
